# Patient Record
Sex: Female | Born: 1980 | Race: White | Hispanic: No | Marital: Married | Smoking: Never smoker
Health system: Southern US, Community
[De-identification: ages and names within clinical notes are randomized; demographics above are authoritative.]

## PROBLEM LIST (undated history)

## (undated) DIAGNOSIS — T79A29A Traumatic compartment syndrome of unspecified lower extremity, initial encounter: Secondary | ICD-10-CM

## (undated) DIAGNOSIS — K219 Gastro-esophageal reflux disease without esophagitis: Secondary | ICD-10-CM

## (undated) DIAGNOSIS — M542 Cervicalgia: Secondary | ICD-10-CM

## (undated) DIAGNOSIS — R200 Anesthesia of skin: Secondary | ICD-10-CM

## (undated) DIAGNOSIS — R51 Headache: Secondary | ICD-10-CM

## (undated) HISTORY — DX: Anesthesia of skin: R20.0

## (undated) HISTORY — PX: ARTHROSCOPIC REPAIR ACL: SUR80

## (undated) HISTORY — DX: Cervicalgia: M54.2

---
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
## 2001-06-29 HISTORY — PX: ANTERIOR COMPARTMENT DECOMPRESSION: SHX547

## 2002-06-29 DIAGNOSIS — T79A29A Traumatic compartment syndrome of unspecified lower extremity, initial encounter: Secondary | ICD-10-CM

## 2002-06-29 HISTORY — DX: Traumatic compartment syndrome of unspecified lower extremity, initial encounter: T79.A29A

## 2003-10-22 ENCOUNTER — Encounter (INDEPENDENT_AMBULATORY_CARE_PROVIDER_SITE_OTHER): Payer: Self-pay | Admitting: *Deleted

## 2003-10-22 ENCOUNTER — Ambulatory Visit (HOSPITAL_COMMUNITY): Admission: RE | Admit: 2003-10-22 | Discharge: 2003-10-22 | Payer: Self-pay | Admitting: Otolaryngology

## 2003-10-22 ENCOUNTER — Ambulatory Visit (HOSPITAL_BASED_OUTPATIENT_CLINIC_OR_DEPARTMENT_OTHER): Admission: RE | Admit: 2003-10-22 | Discharge: 2003-10-22 | Payer: Self-pay | Admitting: Otolaryngology

## 2005-09-03 ENCOUNTER — Other Ambulatory Visit: Admission: RE | Admit: 2005-09-03 | Discharge: 2005-09-03 | Payer: Self-pay | Admitting: Obstetrics & Gynecology

## 2006-02-15 ENCOUNTER — Encounter: Admission: RE | Admit: 2006-02-15 | Discharge: 2006-02-15 | Payer: Self-pay | Admitting: Obstetrics and Gynecology

## 2006-04-22 ENCOUNTER — Inpatient Hospital Stay (HOSPITAL_COMMUNITY): Admission: AD | Admit: 2006-04-22 | Discharge: 2006-04-25 | Payer: Self-pay | Admitting: Obstetrics and Gynecology

## 2006-04-26 ENCOUNTER — Encounter: Admission: RE | Admit: 2006-04-26 | Discharge: 2006-05-13 | Payer: Self-pay | Admitting: Obstetrics and Gynecology

## 2007-04-25 ENCOUNTER — Encounter: Admission: RE | Admit: 2007-04-25 | Discharge: 2007-04-25 | Payer: Self-pay | Admitting: Orthopedic Surgery

## 2010-03-08 ENCOUNTER — Inpatient Hospital Stay (HOSPITAL_COMMUNITY): Admission: RE | Admit: 2010-03-08 | Discharge: 2010-03-10 | Payer: Self-pay | Admitting: Obstetrics and Gynecology

## 2010-09-11 LAB — CBC
HCT: 34.2 % — ABNORMAL LOW (ref 36.0–46.0)
Hemoglobin: 10.1 g/dL — ABNORMAL LOW (ref 12.0–15.0)
MCHC: 34 g/dL (ref 30.0–36.0)
MCHC: 34.8 g/dL (ref 30.0–36.0)
MCV: 100.9 fL — ABNORMAL HIGH (ref 78.0–100.0)
MCV: 99.6 fL (ref 78.0–100.0)
Platelets: 180 10*3/uL (ref 150–400)
RBC: 3.39 MIL/uL — ABNORMAL LOW (ref 3.87–5.11)
RDW: 12.6 % (ref 11.5–15.5)
WBC: 10.9 10*3/uL — ABNORMAL HIGH (ref 4.0–10.5)
WBC: 10.9 10*3/uL — ABNORMAL HIGH (ref 4.0–10.5)

## 2010-09-11 LAB — RPR
RPR Ser Ql: NONREACTIVE
RPR Ser Ql: NONREACTIVE

## 2010-09-11 LAB — SURGICAL PCR SCREEN
MRSA, PCR: NEGATIVE
Staphylococcus aureus: NEGATIVE

## 2010-11-14 NOTE — Op Note (Signed)
NAME:  Mary King, Mary King                       ACCOUNT NO.:  [id number]   MEDICAL RECORD NO.:  [id number]                   PATIENT TYPE:  AMB   LOCATION:  DSC                                  FACILITY:  [facility]   PHYSICIAN:  Jefry H. Pollyann Kennedy, M.D.                DATE OF BIRTH:  [birth year]   DATE OF PROCEDURE:  10/22/2003  DATE OF DISCHARGE:                                 OPERATIVE REPORT   PREOPERATIVE DIAGNOSES:  Chronic tonsillitis and history of peritonsillar  abscess.   POSTOPERATIVE DIAGNOSES:  Chronic tonsillitis and history of peritonsillar  abscess.   OPERATION PERFORMED:  Tonsillectomy.   SURGEON:  Jefry H. Pollyann Kennedy, M.D.   ANESTHESIA:  General endotracheal.   COMPLICATIONS:  None.   ESTIMATED BLOOD LOSS:  30 mL.   FINDINGS:  Large fibrotic tonsils with severe adhesion on the inferior pole  on the left and small abscess right superior pole.   REFERRING PHYSICIAN:  Stan Head. Cleta Alberts, M.D.   INDICATIONS FOR PROCEDURE:  The patient is a 30 year old female with a  history of chronic tonsillitis over the past several years and a history of  a left peritonsillar abscess last year, and what sounds by history to be a  very small early peritonsillar abscess on the right side a few weeks ago  that was successfully treated with systemic antibiotic therapy.  The risks,  benefits, alternatives and complications of the procedure were explained to  the patient, who seemed to understand and agreed to surgery.   DESCRIPTION OF PROCEDURE:  The patient was taken to the operating room and  placed on the operating table in the supine position.  Following induction  of general endotracheal anesthesia, the table was turned 90 degrees and the  patient was draped in standard fashion.  A Crowe-Davis mouth gag was  inserted into the oral cavity and used to retract the tongue and mandible  and attached to the Mayo stand.  A red rubber catheter was inserted into the  right side of the nose and  withdrawn through the mouth and used to retract  the soft palate and uvula.  The tonsillectomy was performed using  electrocautery dissection to dissect the capsule from the constrictor  muscles.  A fissure knife was used on the left side to dissect through some  of the dense fibrotic area inferiorly and the Herd dissector was used to do  a similar function on the right side superiorly.  The tonsils were removed  and sent for pathologic  evaluation.  Suction cautery was used to provide hemostasis.  The pharynx  was suctioned of blood and secretions and irrigated with saline solution.  An orogastric tube was used to aspirate the contents of the stomach.  The  patient was then awakened, extubated and transferred to recovery in stable  condition.  Jefry H. Pollyann Kennedy, M.D.    J[facility]/MEDQ  D:  10/22/2003  T:  10/22/2003  Job:  [id number]   cc:   Brett Canales A. Cleta Alberts, M.D.  [address]  [place]  [place] [address]  Fax: [phone]

## 2010-11-14 NOTE — Op Note (Signed)
NAME:  Mary King, Mary King            ACCOUNT NO.:  [id number]   MEDICAL RECORD NO.:  [id number]          PATIENT TYPE:  INP   LOCATION:  9119                          [facility]ILITY[facility]  [facility]   PHYSICIAN:  Maxie Better, M.D.DATE OF BIRTH:  [birth year]   DATE OF PROCEDURE:  04/22/2006  DATE OF DISCHARGE:                                 OPERATIVE REPORT   PREOPERATIVE DIAGNOSIS:  Fetal macrosomia, class A1 gestational diabetes,  term gestation.   POSTOPERATIVE DIAGNOSIS:  Class A1 gestational diabetes, term gestation.   PROCEDURE:  Primary cesarean section Kerr hysterotomy.   ANESTHESIA:  Spinal.   SURGEON:  Maxie Better, M.D.   ASSISTANT:  None.   INDICATIONS:  30 year old gravida 1, para 0, married white female at term  with class A1 gestational diabetes well controlled who was found to have an  estimated fetal weight of 4190 grams on ultrasound and now presents for  primary cesarean section as a result of fetal macrosomia.  The risks and  benefits of the procedure have been explained to the patient, consent was  signed. The patient was transferred to the operating room.   PROCEDURE:  Under adequate spinal anesthesia, the patient was placed in a  supine position with a left lateral tilt.  She was sterilely prepped and  draped in the usual fashion.  An indwelling Foley catheter was sterilely  placed.  0.25% Marcaine was injected along the planned Pfannenstiel  incision.  A skin incision was then made and carried down to the rectus  fascia.  The rectus fascia was opened transversely.  The rectus fascia was  then bluntly and sharply dissected off the rectus muscle in a superior and  inferior fashion.  The rectus muscles were split in the midline.  The  parietal peritoneum was entered sharply and extended superiorly and  inferiorly. On entering the abdominal cavity, it was noted that the vertex  was overriding the suprapubic bone.  The lower uterine segment was,  otherwise, well  developed. The vesicouterine peritoneum was opened  transversely.  The bladder was then bluntly dissected off the lower uterine  segment and displaced inferiorly with a bladder retractor.  A curvilinear  low transverse uterine incision was then made, extended bilaterally using  bandage scissors.  Artificial rupture of membranes was performed.  Clear  amniotic fluid was noted.  A loop of cord was noted on entering the uterine  cavity.  Subsequent delivery of a live female using vacuum for assistance  was accomplished.  The baby was bulb suctioned on the abdomen. Cord around  the neck x1 was reducible.  The cord was then clamped, cut, and the baby was  transferred to the awaiting pediatrician who assigned Apgars of 9 and 9 at 1  and 5 minutes respectively.  The placenta was anterior, manually removed.  The uterine cavity was cleaned of debris.  The uterine incision had no  extension and was closed in two layers, the first layer a 0 Monocryl running  locked stitch, the second layer was imbricated using 0 Monocryl suture.  Good hemostasis was subsequently noted.  The parietal and the vesicouterine  peritoneum were not closed.  Normal tubes and ovaries were noted  bilaterally.  The abdomen was copiously irrigated and suctioned of debris.  The parietal peritoneum was closed with 2-0 Vicryl running stitch.  The  rectus fascia was closed with 0 Vicryl x2.  The subcutaneous areas were  approximated using 2-0 plain sutures and the skin approximated using Ethicon  staples.   SPECIMENS:  Placenta not sent to pathology.   ESTIMATED BLOOD LOSS:  700 mL.   INTEROPERATIVE FLUID:  2500 mL.   URINE OUTPUT:  100 mL.   COUNTS:  Sponge and instrument counts x2 was correct.   COMPLICATION:  None.   DISPOSITION:  The patient tolerated the procedure well.  The weight of the  baby was 7 pounds 13 ounces.  The patient was transferred to the recovery  room in stable condition.      Maxie Better,  M.D.  Electronically Signed     [place]/MEDQ  D:  04/22/2006  T:  04/24/2006  Job:  [id number]

## 2010-11-14 NOTE — Discharge Summary (Signed)
Mary King, Mary King            ACCOUNT NO.:  [id number]   MEDICAL RECORD NO.:  [id number]          PATIENT TYPE:  INP   LOCATION:  9119                          [facility]ILITY[facility]   PHYSICIAN:  Maxie Better, M.D.DATE OF BIRTH:  [birth year]   DATE OF ADMISSION:  04/22/2006  DATE OF DISCHARGE:  04/25/2006                               DISCHARGE SUMMARY   ADMISSION DIAGNOSES:  1. Term gestation.  2. Class A1 gestational diabetes.  3. Fetal macrosomia.   DISCHARGE DIAGNOSES:  1. Term gestation, delivered.  2. Class A1 gestational diabetes.  3. Status post primary cesarean section.   PROCEDURE:  Primary cesarean section, Kerr hysterotomy.   HISTORY OF PRESENT ILLNESS:  This is a 31 year old G1, P0 female at term  with class A1 gestational diabetes diagnosed with fetal macrosomia who  presents for primary cesarean section.   HOSPITAL COURSE:  The patient was admitted for primary cesarean section.  She underwent an uncomplicated primary cesarean section under spinal  anesthesia. The procedure resulted in the delivery of a 7 pounds 13  ounces live female, Apgars of 9 and 9. Normal tubes and ovaries were  identified at the time of surgery. There was cord around the neck x1.  The patient had uncomplicated postoperative course. Her CBC on postop  day #1 showed a hemoglobin of 10.6, hematocrit of 30.2, white count of  13.1, platelet count 236,000. By postop day #3 the patient was  tolerating a regular diet, no evidence of infection. Incision was  without erythema, induration or exudate. She was deemed well to be  discharged home.   DISPOSITION:  Home.   CONDITION ON DISCHARGE:  Stable.   DISCHARGE MEDICATIONS:  1. Tylox 1-2 tablets every 4-6 hours p.r.n. pain.  2. Colace 1-2 tablets as needed.  3. Motrin 600 mg 1 p.o., q.6 hours, p.r.n. pain.   DISCHARGE INSTRUCTIONS:  Per the postpartum booklet given.   FOLLOWUP:  Appointment at [facility] in 6 weeks.      Maxie Better, M.D.  Electronically Signed     [place]/MEDQ  D:  05/23/2006  T:  05/23/2006  Job:  [id number]

## 2012-02-18 LAB — OB RESULTS CONSOLE RUBELLA ANTIBODY, IGM: Rubella: IMMUNE

## 2012-02-18 LAB — OB RESULTS CONSOLE HIV ANTIBODY (ROUTINE TESTING): HIV: NONREACTIVE

## 2012-02-18 LAB — OB RESULTS CONSOLE ABO/RH: RH Type: POSITIVE

## 2012-03-01 ENCOUNTER — Other Ambulatory Visit: Payer: Self-pay

## 2012-04-25 ENCOUNTER — Other Ambulatory Visit (HOSPITAL_COMMUNITY): Payer: Self-pay | Admitting: Obstetrics and Gynecology

## 2012-04-25 DIAGNOSIS — O283 Abnormal ultrasonic finding on antenatal screening of mother: Secondary | ICD-10-CM

## 2012-04-27 ENCOUNTER — Other Ambulatory Visit (HOSPITAL_COMMUNITY): Payer: Self-pay | Admitting: Obstetrics and Gynecology

## 2012-04-27 ENCOUNTER — Ambulatory Visit (HOSPITAL_COMMUNITY)
Admission: RE | Admit: 2012-04-27 | Discharge: 2012-04-27 | Disposition: A | Discharge status: Home / Self Care | Payer: BC Managed Care – PPO | Source: Ambulatory Visit | Attending: Obstetrics and Gynecology | Admitting: Obstetrics and Gynecology

## 2012-04-27 ENCOUNTER — Encounter (HOSPITAL_COMMUNITY): Payer: Self-pay

## 2012-04-27 DIAGNOSIS — Z1389 Encounter for screening for other disorder: Secondary | ICD-10-CM | POA: Insufficient documentation

## 2012-04-27 DIAGNOSIS — O283 Abnormal ultrasonic finding on antenatal screening of mother: Secondary | ICD-10-CM

## 2012-04-27 DIAGNOSIS — O34219 Maternal care for unspecified type scar from previous cesarean delivery: Secondary | ICD-10-CM | POA: Insufficient documentation

## 2012-04-27 DIAGNOSIS — Z363 Encounter for antenatal screening for malformations: Secondary | ICD-10-CM | POA: Insufficient documentation

## 2012-04-27 DIAGNOSIS — O358XX0 Maternal care for other (suspected) fetal abnormality and damage, not applicable or unspecified: Secondary | ICD-10-CM | POA: Insufficient documentation

## 2012-04-27 LAB — AP-AFP (ALPHA FETOPROTEIN)

## 2012-04-27 NOTE — Progress Notes (Signed)
[facility] ultrasound  Indication: 31 yr old [id number] at [redacted]w[redacted]d for fetal anatomic survey. Findings of enlarged nuchal fold and echogenic bowel on outside ultrasound.  Findings: 1. Single intrauterine pregnancy. 2. Fetal biometry is consistent with dating. 3. Posterior placenta without evidence of previa. 4. Normal amniotic fluid volume. 5. Normal transabdominal cervical length. 6. There is an enlarged nuchal fold measuring 6.59mm. 7. The bowel appears echogenic. 8. The remainder of the anatomic survey is normal.  Recommendations:  1. Appropriate fetal growth. 2. Thickened nuchal fold: - see genetic counselor consultation - discussed association with fetal aneuploidy - reviewed risks/benefits of amniocentesis; after counseling patient opted for amniocentesis which was performed without complication 3. Echogenic bowel: - see genetic counselor consultation I reiterated the associations of echogenic bowel with fetal aneuploidy, cystic fibrosis, congenital infection, and bleeding.  There is a higher risk of fetal growth restriction in fetuses with echogenic bowel (up to 20%).  Congenital infections such as toxoplasmosis and CMV have also been associated with echogenic bowel.  Given the above the patient had blood work drawn to screen for cystic fibrosis carrier status and the amniotic fluid will be sent for toxoplasmosis and CMV PCR. 4. If all the above results are normal recommend follow up ultrasound for fetal growth at [redacted] weeks gestation; with serial fetal growth in the third trimester given the increased risk of fetal growth restriction. If any of the above results are abnormal will counsel accordingly.  Eulis Foster, MD

## 2012-04-27 NOTE — Progress Notes (Signed)
Genetic Counseling  High-Risk Gestation Note  Appointment Date:  04/27/2012 Referred By: Serita Kyle, * Date of Birth:  [birth year]  Pregnancy History: [id number] Estimated Date of Delivery: 09/20/12 Estimated Gestational Age: [redacted]w[redacted]d   I met with [name] and her husband, Mary King, for genetic counseling because of abnormal ultrasound findings. Mary King, [facility] Genetic Counseling Intern, observed the session and obtained the family history.    Mary King had ultrasound at [facility] on Monday, April 25, 2012, which revealed fetal echogenic bowel and a thickened nuchal fold.  The patient was referred to [facility] for ultrasound an consultation.  Ultrasound today confirmed the findings.  The ultrasound report will be documented separately.  This couple was counseled that the second trimester genetic ultrasound is targeted at identifying features associated with aneuploidy as well as other conditions.  It has evolved as a screening tool used to provide an individualized risk assessment for Down syndrome and other trisomies.  The ability of sonography to aid in the detection of aneuploidies relies on identification of both major structural anomalies and soft markers.  They were counseled that markers have a known association with aneuploidy and that some are associated with higher risk than others.  The detection of multiple markers greatly increases the suspicion of aneuploidy.    We discussed that the bowel is defined as being echogenic if it is as bright as adjacent bone.  Echogenic bowel is present in ~0.6-2.4% of normal fetuses, but is associated with a six fold increase in risk for aneuploidy (when observed in isolation).  In addition, it is associated with a 2% risk for fetal cystic fibrosis, 3% risk for a congenital infection, and a 3% risk for a bleed.  Additionally, they were counseled that the nuchal fold (NF) refers to the  skin thickness in the posterior aspect of the fetal neck. This area is traditionally measurable between 15 and 19.[redacted] weeks gestation and is considered enlarged at ?6 mm.  This couple was counseled regarding the various common etiologies for an enlarged NF including: aneuploidy, other chromosome aberrations, single gene conditions, cardiac or great vessel abnormalities, and normal variation.    We reviewed chromosomes, nondisjunction, and the common features and prognoses of Down syndrome and other aneuploidies. Considering Mary King's ultrascreen result (1 in 10,000 risks for Down syndrome and trisomies 13/18) and the combination of ultrasound findings, the risk for fetal aneuploidy is estimated to be ~5%. We also discussed single gene conditions and that these conditions are not routinely tested for prenatally unless ultrasound findings or family history significantly increase the suspicion of a specific single gene disorder. We briefly reviewed common inheritance patterns (dominant, recessive, and X-linked) as well as the associated risks of recurrence.   In addition, we discussed that there is a known correlation between fetal heart defects and increased NF measurements.  Although this data is less clear than the association found between abnormal nuchal translucency measurements and fetal heart defects, the finding of an increased NF warrants further cardiac investigation/fetal echocardiogram .   They were then counseled regarding their options of noninvasive prenatal testing (also referred to as cell free fetal DNA testing), amniocentesis, serial sonography, cystic fibrosis carrier screening, maternal serum/amniotic fluid infectious titers, and fetal echocardiogram.  We reviewed the benefits, limitations, and risks of these options. After thoughtful consideration, they elected to proceed with amniocentesis for both chromosome and infectious analyses.  They both elected to have cystic fibrosis carrier  screening today.  Fetal echocardiogram and a follow up ultrasound were scheduled today.    They were counseled that the fetal prognosis depends on the underlying etiology of the ultrasound findings and further anticipatory guidance can be provided if a diagnosis is discovered.  We discussed the availability of a postnatal consult with a pediatric geneticist to help determine the etiology, if warranted.  We discussed the availability of prenatal microarray analysis including the limitations and benefits of this technology, if the karyotype is normal.  They were undecided about this option, given the risk for discovering a variant of unknown significance with this technology.   Both family histories were reviewed and found to be noncontributory for birth defects, mental retardation, and known genetic conditions. Without further information regarding the provided family history, an accurate genetic risk cannot be calculated. Further genetic counseling is warranted if more information is obtained.  Mary King denied exposure to environmental toxins or chemical agents. She denied the use of alcohol, tobacco or street drugs. She denied significant viral illnesses during the course of her pregnancy.   In addition, I spoke to the patient about the [facility] amniotic fluid collection research study (IRB# [id number]). The study was explained to the patient and any questions were answered. The patient gave her consent to take part, signed the consent form and copy of the signed form was given to her. The patient's blood was drawn and amniotic fluid collected per the research protocol.  I counseled Mary King  regarding the above risks and available options.  The approximate face-to-face time with the genetic counselor was 65 minutes.  Donald Prose, MS Certified Genetic Counselor

## 2012-04-28 ENCOUNTER — Other Ambulatory Visit: Payer: Self-pay

## 2012-05-02 ENCOUNTER — Telehealth (HOSPITAL_COMMUNITY): Payer: Self-pay

## 2012-05-02 NOTE — Telephone Encounter (Signed)
Called Mary King to discuss the preliminary FISH results from her amniocentesis. We reviewed that these are within normal limits.  We again discussed the limitations of FISH and that final results are still pending and will be available in 1-2 weeks.  Patient reported that she has not noticed any concerning symptoms or complications following the procedure.    In addition, we reviewed the results from her and her husband's CF carrier testing. 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  We reviewed that this is the most common of the CFTR gene alterations and is a severe alteration.  She understands that carriers have no symptoms from the gene change.  We reviewed recessive inheritance.  Mary King results were negative for the common 32 gene changes, reducing the likelihood that he is a carrier from 1 in 39 to 1 in 250.  The risk for CF in the fetus is estimated to be ~1 in 1000, given these two results.  However, when considering the finding of echogenic bowel, we discussed that the risk of fetal CF is actually higher, and could be as high as 2%.  We reviewed additional screening/testing options for Mary King including: an extended CFTR mutation panel (which raises the detection rate by a few percent-depending on the lab) or CFTR gene sequencing.  We discussed that gene sequencing turn around time is ~4 weeks.  They understand that prenatal testing for CF is most informative when both parents have identified CFTR gene alterations.  We again reviewed the availability of newborn screening for CF and the common features and prognosis of CF.  This couple does not wish to pursue any additional testing for CF at this time.  All questions were answered to her satisfaction, she was encouraged to call with additional questions or concerns.  Despina Arias, MS Certified Genetic Counselor

## 2012-05-24 ENCOUNTER — Telehealth (HOSPITAL_COMMUNITY): Payer: Self-pay

## 2012-06-08 NOTE — Telephone Encounter (Signed)
Called Ms. Stallsmith to review the final results of her chromosome analysis.  These results are within normal limits.  We discussed the availability of prenatal microarray analysis.  She was counseled that microarray analysis detects a genomic copy number variant (CNV) in ~6% of fetuses with an abnormal ultrasound finding(s) and a normal karyotype; however, the significance of that CNV may not be known. After thoughtful consideration of this option, she declined.    We again briefly reviewed the availability of f/u CFTR testing for Mr. Eliot; Mrs. Foot again declined.  She understands that the significance of the u/s findings may not be known during pregnancy.  We again reviewed other possible explanations including normal variation, single gene conditions, and chromosome aberrations not detectable by routine chromosome analysis.  We reviewed the availability of a postnatal consultation with a medical geneticist, if warranted.  All questions were answered.  The patient was encouraged to contact us with additional questions and concerns. Despina Arias, MS Certified Genetic Counselor

## 2012-07-01 ENCOUNTER — Ambulatory Visit (HOSPITAL_COMMUNITY)
Admission: RE | Admit: 2012-07-01 | Discharge: 2012-07-01 | Disposition: A | Discharge status: Home / Self Care | Payer: BC Managed Care – PPO | Source: Ambulatory Visit | Attending: Obstetrics and Gynecology | Admitting: Obstetrics and Gynecology

## 2012-07-01 VITALS — BP 127/81 | HR 110 | Wt 180.2 lb

## 2012-07-01 DIAGNOSIS — O34219 Maternal care for unspecified type scar from previous cesarean delivery: Secondary | ICD-10-CM | POA: Insufficient documentation

## 2012-07-01 DIAGNOSIS — O283 Abnormal ultrasonic finding on antenatal screening of mother: Secondary | ICD-10-CM

## 2012-07-01 DIAGNOSIS — O09299 Supervision of pregnancy with other poor reproductive or obstetric history, unspecified trimester: Secondary | ICD-10-CM | POA: Insufficient documentation

## 2012-07-01 DIAGNOSIS — O358XX0 Maternal care for other (suspected) fetal abnormality and damage, not applicable or unspecified: Secondary | ICD-10-CM | POA: Insufficient documentation

## 2012-07-01 NOTE — Progress Notes (Signed)
Mary King  was seen today for an ultrasound appointment.  See full report in [facility].  Comments: [name] returns for follow up.  On previous ultrasound, noted to have echogenic bowel and thickened nuchal fold.  Amniocentesis was performed which revealed a normal karyotype.  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  Risk of viral infection (CMV/Toxo) was discussed at previous evaluation and there was some discussion of sending amniotic fluid for PCR, but the results are either not available or were not performed at that time.  This was discussed with the patient - offered serum testing for TORCH titers.  The patient declined further work up for echogenic bowel at this time.  Impression: Single IUP at 28 3/7 weeks Interval growth is appropriate (60th %tile) The fetal bowel is prominent, but not as echogenic as adjacent bone The fetal anatomy is otherwise within normal limits Normal amniotic fluid volume  Recommendations: Recommend follow-up ultrasound examination in 4 weeks for interval growth.  Alpha Gula, MD

## 2012-07-29 ENCOUNTER — Ambulatory Visit (HOSPITAL_COMMUNITY): Admission: RE | Admit: 2012-07-29 | Payer: BC Managed Care – PPO | Source: Ambulatory Visit

## 2012-08-19 ENCOUNTER — Other Ambulatory Visit: Payer: Self-pay | Admitting: Obstetrics and Gynecology

## 2012-08-30 ENCOUNTER — Encounter (HOSPITAL_COMMUNITY): Payer: Self-pay | Admitting: Pharmacist

## 2012-09-12 ENCOUNTER — Encounter (HOSPITAL_COMMUNITY)
Admission: RE | Admit: 2012-09-12 | Discharge: 2012-09-12 | Disposition: A | Discharge status: Home / Self Care | Payer: BC Managed Care – PPO | Source: Ambulatory Visit | Attending: Obstetrics and Gynecology | Admitting: Obstetrics and Gynecology

## 2012-09-12 ENCOUNTER — Encounter (HOSPITAL_COMMUNITY): Payer: Self-pay

## 2012-09-12 HISTORY — DX: Headache: R51

## 2012-09-12 HISTORY — DX: Gastro-esophageal reflux disease without esophagitis: K21.9

## 2012-09-12 HISTORY — DX: Traumatic compartment syndrome of unspecified lower extremity, initial encounter: T79.A29A

## 2012-09-12 LAB — CBC
MCH: 33.3 pg (ref 26.0–34.0)
MCHC: 33 g/dL (ref 30.0–36.0)
Platelets: 248 10*3/uL (ref 150–400)
RDW: 13.6 % (ref 11.5–15.5)

## 2012-09-12 NOTE — Patient Instructions (Addendum)
   Your procedure is scheduled XB:[id number] March 18th  Enter through the Main Entrance of [facility] at:6am Pick up the phone at the desk and dial [phone] and inform us of your arrival.  Please call this number if you have any problems the morning of surgery: [phone]  Remember: Do not eat or drink anything after midnight on Monday   Do not wear jewelry, make-up, or FINGER nail polish No metal in your hair or on your body. Do not wear lotions, powders, perfumes. You may wear deodorant.  Please use your CHG wash as directed prior to surgery.  Do not shave anywhere for at least 12 hours prior to first CHG shower.  Do not bring valuables to the hospital.   Leave suitcase in the car. After Surgery it may be brought to your room. For patients being admitted to the hospital, checkout time is 11:00am the day of discharge.

## 2012-09-13 ENCOUNTER — Inpatient Hospital Stay (HOSPITAL_COMMUNITY): Payer: BC Managed Care – PPO | Admitting: Registered Nurse

## 2012-09-13 ENCOUNTER — Encounter (HOSPITAL_COMMUNITY)
Admission: RE | Disposition: A | Discharge status: Home / Self Care | Payer: Self-pay | Source: Ambulatory Visit | Attending: Obstetrics and Gynecology

## 2012-09-13 ENCOUNTER — Encounter (HOSPITAL_COMMUNITY): Payer: Self-pay | Admitting: *Deleted

## 2012-09-13 ENCOUNTER — Encounter (HOSPITAL_COMMUNITY): Payer: Self-pay | Admitting: Registered Nurse

## 2012-09-13 ENCOUNTER — Inpatient Hospital Stay (HOSPITAL_COMMUNITY)
Admission: RE | Admit: 2012-09-13 | Discharge: 2012-09-15 | DRG: 370 | Disposition: A | Discharge status: Home / Self Care | Payer: BC Managed Care – PPO | Source: Ambulatory Visit | Attending: Obstetrics and Gynecology | Admitting: Obstetrics and Gynecology

## 2012-09-13 DIAGNOSIS — O9903 Anemia complicating the puerperium: Secondary | ICD-10-CM | POA: Diagnosis not present

## 2012-09-13 DIAGNOSIS — O9902 Anemia complicating childbirth: Secondary | ICD-10-CM | POA: Diagnosis present

## 2012-09-13 DIAGNOSIS — D62 Acute posthemorrhagic anemia: Secondary | ICD-10-CM | POA: Diagnosis not present

## 2012-09-13 DIAGNOSIS — O34219 Maternal care for unspecified type scar from previous cesarean delivery: Principal | ICD-10-CM | POA: Diagnosis present

## 2012-09-13 DIAGNOSIS — Z98891 History of uterine scar from previous surgery: Secondary | ICD-10-CM | POA: Diagnosis present

## 2012-09-13 LAB — RPR: RPR Ser Ql: NONREACTIVE

## 2012-09-13 SURGERY — Surgical Case
Anesthesia: Spinal | Site: Abdomen | Wound class: Clean Contaminated

## 2012-09-13 MED ORDER — SIMETHICONE 80 MG PO CHEW
80.0000 mg | CHEWABLE_TABLET | Freq: Three times a day (TID) | ORAL | Status: DC
  Administered 2012-09-13 – 2012-09-15 (×5): 80 mg via ORAL

## 2012-09-13 MED ORDER — SODIUM CHLORIDE 0.9 % IV SOLN: 250.0000 mL | INTRAVENOUS | Status: DC

## 2012-09-13 MED ORDER — NALOXONE HCL 0.4 MG/ML IJ SOLN: 0.4000 mg | INTRAMUSCULAR | Status: DC | PRN

## 2012-09-13 MED ORDER — ONDANSETRON HCL 4 MG/2ML IJ SOLN: 4.0000 mg | INTRAMUSCULAR | Status: DC | PRN

## 2012-09-13 MED ORDER — CEFAZOLIN SODIUM-DEXTROSE 2-3 GM-% IV SOLR
2.0000 g | INTRAVENOUS | Status: AC
  Administered 2012-09-13: 2 g via INTRAVENOUS

## 2012-09-13 MED ORDER — SENNOSIDES-DOCUSATE SODIUM 8.6-50 MG PO TABS
2.0000 | ORAL_TABLET | Freq: Every day | ORAL | Status: DC
  Administered 2012-09-13 – 2012-09-14 (×2): 2 via ORAL

## 2012-09-13 MED ORDER — ONDANSETRON HCL 4 MG/2ML IJ SOLN: 4.0000 mg | Freq: Three times a day (TID) | INTRAMUSCULAR | Status: DC | PRN

## 2012-09-13 MED ORDER — BUPIVACAINE HCL (PF) 0.25 % IJ SOLN
INTRAMUSCULAR | Status: AC
  Filled 2012-09-13: qty 30

## 2012-09-13 MED ORDER — FLEET ENEMA 7-19 GM/118ML RE ENEM: 1.0000 | ENEMA | Freq: Every day | RECTAL | Status: DC | PRN

## 2012-09-13 MED ORDER — DIPHENHYDRAMINE HCL 50 MG/ML IJ SOLN: 25.0000 mg | INTRAMUSCULAR | Status: DC | PRN

## 2012-09-13 MED ORDER — FENTANYL CITRATE 0.05 MG/ML IJ SOLN
INTRAMUSCULAR | Status: DC | PRN
  Administered 2012-09-13: 35 ug via INTRAVENOUS
  Administered 2012-09-13: 15 ug via INTRATHECAL
  Administered 2012-09-13: 50 ug via INTRAVENOUS

## 2012-09-13 MED ORDER — KETOROLAC TROMETHAMINE 30 MG/ML IJ SOLN: 30.0000 mg | Freq: Four times a day (QID) | INTRAMUSCULAR | Status: AC | PRN

## 2012-09-13 MED ORDER — FENTANYL CITRATE 0.05 MG/ML IJ SOLN: 25.0000 ug | INTRAMUSCULAR | Status: DC | PRN

## 2012-09-13 MED ORDER — BUPIVACAINE IN DEXTROSE 0.75-8.25 % IT SOLN
INTRATHECAL | Status: DC | PRN
  Administered 2012-09-13: 1.4 mg via INTRATHECAL

## 2012-09-13 MED ORDER — METHYLERGONOVINE MALEATE 0.2 MG PO TABS: 0.2000 mg | ORAL_TABLET | ORAL | Status: DC | PRN

## 2012-09-13 MED ORDER — ONDANSETRON HCL 4 MG/2ML IJ SOLN
INTRAMUSCULAR | Status: AC
  Filled 2012-09-13: qty 2

## 2012-09-13 MED ORDER — FERROUS SULFATE 325 (65 FE) MG PO TABS
325.0000 mg | ORAL_TABLET | Freq: Two times a day (BID) | ORAL | Status: DC
  Administered 2012-09-14 – 2012-09-15 (×3): 325 mg via ORAL
  Filled 2012-09-13 (×3): qty 1

## 2012-09-13 MED ORDER — LANOLIN HYDROUS EX OINT: 1.0000 "application " | TOPICAL_OINTMENT | CUTANEOUS | Status: DC | PRN

## 2012-09-13 MED ORDER — PHENYLEPHRINE 40 MCG/ML (10ML) SYRINGE FOR IV PUSH (FOR BLOOD PRESSURE SUPPORT)
PREFILLED_SYRINGE | INTRAVENOUS | Status: AC
  Filled 2012-09-13: qty 5

## 2012-09-13 MED ORDER — IBUPROFEN 600 MG PO TABS
600.0000 mg | ORAL_TABLET | Freq: Four times a day (QID) | ORAL | Status: DC
  Administered 2012-09-14 – 2012-09-15 (×7): 600 mg via ORAL
  Filled 2012-09-13 (×3): qty 1

## 2012-09-13 MED ORDER — NALBUPHINE HCL 10 MG/ML IJ SOLN
5.0000 mg | INTRAMUSCULAR | Status: DC | PRN
  Filled 2012-09-13: qty 1

## 2012-09-13 MED ORDER — BISACODYL 10 MG RE SUPP: 10.0000 mg | Freq: Every day | RECTAL | Status: DC | PRN

## 2012-09-13 MED ORDER — BUPIVACAINE HCL (PF) 0.25 % IJ SOLN
INTRAMUSCULAR | Status: DC | PRN
  Administered 2012-09-13: 7 mL

## 2012-09-13 MED ORDER — NALOXONE HCL 1 MG/ML IJ SOLN
1.0000 ug/kg/h | INTRAVENOUS | Status: DC | PRN
  Filled 2012-09-13: qty 2

## 2012-09-13 MED ORDER — DIBUCAINE 1 % RE OINT: 1.0000 "application " | TOPICAL_OINTMENT | RECTAL | Status: DC | PRN

## 2012-09-13 MED ORDER — SCOPOLAMINE 1 MG/3DAYS TD PT72: 1.0000 | MEDICATED_PATCH | Freq: Once | TRANSDERMAL | Status: DC

## 2012-09-13 MED ORDER — MEPERIDINE HCL 25 MG/ML IJ SOLN
6.2500 mg | INTRAMUSCULAR | Status: AC | PRN
  Administered 2012-09-13: 6.25 mg via INTRAVENOUS

## 2012-09-13 MED ORDER — DIPHENHYDRAMINE HCL 25 MG PO CAPS: 25.0000 mg | ORAL_CAPSULE | ORAL | Status: DC | PRN

## 2012-09-13 MED ORDER — MORPHINE SULFATE (PF) 0.5 MG/ML IJ SOLN
INTRAMUSCULAR | Status: DC | PRN
  Administered 2012-09-13: .1 mg via INTRATHECAL

## 2012-09-13 MED ORDER — SCOPOLAMINE 1 MG/3DAYS TD PT72
MEDICATED_PATCH | TRANSDERMAL | Status: AC
  Administered 2012-09-13: 1.5 mg via TRANSDERMAL
  Filled 2012-09-13: qty 1

## 2012-09-13 MED ORDER — SODIUM CHLORIDE 0.9 % IJ SOLN: 3.0000 mL | Freq: Two times a day (BID) | INTRAMUSCULAR | Status: DC

## 2012-09-13 MED ORDER — SIMETHICONE 80 MG PO CHEW: 80.0000 mg | CHEWABLE_TABLET | ORAL | Status: DC | PRN

## 2012-09-13 MED ORDER — KETOROLAC TROMETHAMINE 60 MG/2ML IM SOLN
INTRAMUSCULAR | Status: AC
  Administered 2012-09-13: 60 mg via INTRAMUSCULAR
  Filled 2012-09-13: qty 2

## 2012-09-13 MED ORDER — DIPHENHYDRAMINE HCL 25 MG PO CAPS: 25.0000 mg | ORAL_CAPSULE | Freq: Four times a day (QID) | ORAL | Status: DC | PRN

## 2012-09-13 MED ORDER — PRENATAL MULTIVITAMIN CH
1.0000 | ORAL_TABLET | Freq: Every day | ORAL | Status: DC
  Administered 2012-09-15: 1 via ORAL
  Filled 2012-09-13: qty 1

## 2012-09-13 MED ORDER — KETOROLAC TROMETHAMINE 30 MG/ML IJ SOLN
30.0000 mg | Freq: Four times a day (QID) | INTRAMUSCULAR | Status: AC | PRN
  Administered 2012-09-13: 30 mg via INTRAVENOUS
  Filled 2012-09-13: qty 1

## 2012-09-13 MED ORDER — ONDANSETRON HCL 4 MG/2ML IJ SOLN
INTRAMUSCULAR | Status: DC | PRN
  Administered 2012-09-13: 4 mg via INTRAVENOUS

## 2012-09-13 MED ORDER — SODIUM CHLORIDE 0.9 % IJ SOLN: 3.0000 mL | INTRAMUSCULAR | Status: DC | PRN

## 2012-09-13 MED ORDER — LACTATED RINGERS IV SOLN
INTRAVENOUS | Status: DC
  Administered 2012-09-13 (×3): via INTRAVENOUS

## 2012-09-13 MED ORDER — METOCLOPRAMIDE HCL 5 MG/ML IJ SOLN: 10.0000 mg | Freq: Three times a day (TID) | INTRAMUSCULAR | Status: DC | PRN

## 2012-09-13 MED ORDER — OXYTOCIN 10 UNIT/ML IJ SOLN
40.0000 [IU] | INTRAVENOUS | Status: DC | PRN
  Administered 2012-09-13: 40 [IU] via INTRAVENOUS

## 2012-09-13 MED ORDER — FENTANYL CITRATE 0.05 MG/ML IJ SOLN
INTRAMUSCULAR | Status: AC
  Filled 2012-09-13: qty 2

## 2012-09-13 MED ORDER — ZOLPIDEM TARTRATE 5 MG PO TABS: 5.0000 mg | ORAL_TABLET | Freq: Every evening | ORAL | Status: DC | PRN

## 2012-09-13 MED ORDER — KETOROLAC TROMETHAMINE 60 MG/2ML IM SOLN: 60.0000 mg | Freq: Once | INTRAMUSCULAR | Status: AC | PRN

## 2012-09-13 MED ORDER — OXYTOCIN 10 UNIT/ML IJ SOLN
INTRAMUSCULAR | Status: AC
  Filled 2012-09-13: qty 4

## 2012-09-13 MED ORDER — ONDANSETRON HCL 4 MG PO TABS: 4.0000 mg | ORAL_TABLET | ORAL | Status: DC | PRN

## 2012-09-13 MED ORDER — OXYTOCIN 40 UNITS IN LACTATED RINGERS INFUSION - SIMPLE MED: 62.5000 mL/h | INTRAVENOUS | Status: AC

## 2012-09-13 MED ORDER — IBUPROFEN 600 MG PO TABS
600.0000 mg | ORAL_TABLET | Freq: Four times a day (QID) | ORAL | Status: DC | PRN
  Filled 2012-09-13 (×4): qty 1

## 2012-09-13 MED ORDER — MENTHOL 3 MG MT LOZG: 1.0000 | LOZENGE | OROMUCOSAL | Status: DC | PRN

## 2012-09-13 MED ORDER — WITCH HAZEL-GLYCERIN EX PADS: 1.0000 "application " | MEDICATED_PAD | CUTANEOUS | Status: DC | PRN

## 2012-09-13 MED ORDER — MORPHINE SULFATE 0.5 MG/ML IJ SOLN
INTRAMUSCULAR | Status: AC
  Filled 2012-09-13: qty 10

## 2012-09-13 MED ORDER — METHYLERGONOVINE MALEATE 0.2 MG/ML IJ SOLN: 0.2000 mg | INTRAMUSCULAR | Status: DC | PRN

## 2012-09-13 MED ORDER — MEPERIDINE HCL 25 MG/ML IJ SOLN
INTRAMUSCULAR | Status: AC
  Administered 2012-09-13: 6.25 mg via INTRAVENOUS
  Filled 2012-09-13: qty 1

## 2012-09-13 MED ORDER — PHENYLEPHRINE HCL 10 MG/ML IJ SOLN
INTRAMUSCULAR | Status: DC | PRN
  Administered 2012-09-13 (×5): 80 ug via INTRAVENOUS

## 2012-09-13 MED ORDER — DIPHENHYDRAMINE HCL 50 MG/ML IJ SOLN: 12.5000 mg | INTRAMUSCULAR | Status: DC | PRN

## 2012-09-13 MED ORDER — OXYCODONE-ACETAMINOPHEN 5-325 MG PO TABS
1.0000 | ORAL_TABLET | ORAL | Status: DC | PRN
  Administered 2012-09-13 – 2012-09-14 (×5): 1 via ORAL
  Administered 2012-09-14 – 2012-09-15 (×5): 2 via ORAL
  Filled 2012-09-13 (×2): qty 2
  Filled 2012-09-13 (×2): qty 1
  Filled 2012-09-13: qty 2
  Filled 2012-09-13: qty 1
  Filled 2012-09-13: qty 2
  Filled 2012-09-13: qty 1
  Filled 2012-09-13: qty 2
  Filled 2012-09-13: qty 1

## 2012-09-13 SURGICAL SUPPLY — 39 items
BARRIER ADHS 3X4 INTERCEED (GAUZE/BANDAGES/DRESSINGS) ×2 IMPLANT
BENZOIN TINCTURE PRP APPL 2/3 (GAUZE/BANDAGES/DRESSINGS) IMPLANT
CLOTH BEACON ORANGE TIMEOUT ST (SAFETY) ×2 IMPLANT
CONTAINER PREFILL 10% NBF 15ML (MISCELLANEOUS) IMPLANT
DRAPE LG THREE QUARTER DISP (DRAPES) ×2 IMPLANT
DRSG OPSITE POSTOP 4X10 (GAUZE/BANDAGES/DRESSINGS) ×2 IMPLANT
DURAPREP 26ML APPLICATOR (WOUND CARE) ×2 IMPLANT
ELECT REM PT RETURN 9FT ADLT (ELECTROSURGICAL) ×2
ELECTRODE REM PT RTRN 9FT ADLT (ELECTROSURGICAL) ×1 IMPLANT
EXTRACTOR VACUUM KIWI (MISCELLANEOUS) IMPLANT
EXTRACTOR VACUUM M CUP 4 TUBE (SUCTIONS) IMPLANT
GLOVE BIO SURGEON STRL SZ 6.5 (GLOVE) ×2 IMPLANT
GLOVE BIOGEL PI IND STRL 7.0 (GLOVE) ×1 IMPLANT
GLOVE BIOGEL PI INDICATOR 7.0 (GLOVE) ×1
GOWN STRL REIN XL XLG (GOWN DISPOSABLE) ×4 IMPLANT
KIT ABG SYR 3ML LUER SLIP (SYRINGE) IMPLANT
NEEDLE HYPO 25X1 1.5 SAFETY (NEEDLE) ×2 IMPLANT
NEEDLE HYPO 25X5/8 SAFETYGLIDE (NEEDLE) IMPLANT
NS IRRIG 1000ML POUR BTL (IV SOLUTION) ×2 IMPLANT
PACK C SECTION WH (CUSTOM PROCEDURE TRAY) IMPLANT
PAD OB MATERNITY 4.3X12.25 (PERSONAL CARE ITEMS) ×2 IMPLANT
RTRCTR C-SECT PINK 25CM LRG (MISCELLANEOUS) IMPLANT
SLEEVE SCD COMPRESS KNEE MED (MISCELLANEOUS) IMPLANT
STAPLER VISISTAT 35W (STAPLE) ×2 IMPLANT
STRIP CLOSURE SKIN 1/2X4 (GAUZE/BANDAGES/DRESSINGS) IMPLANT
SUT CHROMIC GUT AB #0 18 (SUTURE) IMPLANT
SUT MNCRL 0 VIOLET CTX 36 (SUTURE) ×4 IMPLANT
SUT MON AB 4-0 PS1 27 (SUTURE) IMPLANT
SUT MONOCRYL 0 CTX 36 (SUTURE) ×4
SUT PLAIN 2 0 (SUTURE)
SUT PLAIN ABS 2-0 CT1 27XMFL (SUTURE) IMPLANT
SUT VIC AB 0 CT1 27 (SUTURE) ×2
SUT VIC AB 0 CT1 27XBRD ANBCTR (SUTURE) ×2 IMPLANT
SUT VIC AB 2-0 CT1 27 (SUTURE) ×1
SUT VIC AB 2-0 CT1 TAPERPNT 27 (SUTURE) ×1 IMPLANT
SYR CONTROL 10ML LL (SYRINGE) ×2 IMPLANT
TOWEL OR 17X24 6PK STRL BLUE (TOWEL DISPOSABLE) ×6 IMPLANT
TRAY FOLEY CATH 14FR (SET/KITS/TRAYS/PACK) IMPLANT
WATER STERILE IRR 1000ML POUR (IV SOLUTION) ×2 IMPLANT

## 2012-09-13 NOTE — Anesthesia Preprocedure Evaluation (Signed)
Anesthesia Evaluation  Patient identified by MRN, date of birth, ID band Patient awake    Reviewed: Allergy & Precautions, H&P , NPO status , Patient's Chart, lab work & pertinent test results, reviewed documented beta blocker date and time   History of Anesthesia Complications Negative for: history of anesthetic complications  Airway Mallampati: II TM Distance: >3 FB Neck ROM: full    Dental  (+) Teeth Intact   Pulmonary neg pulmonary ROS,  breath sounds clear to auscultation        Cardiovascular negative cardio ROS  Rhythm:regular Rate:Normal     Neuro/Psych Low back pain with pregnancy negative neurological ROS  negative psych ROS   GI/Hepatic negative GI ROS, Neg liver ROS, GERD- (with pregnancy, tums prn)  Controlled,  Endo/Other  negative endocrine ROS  Renal/GU negative Renal ROS  negative genitourinary   Musculoskeletal   Abdominal   Peds  Hematology negative hematology ROS (+)   Anesthesia Other Findings   Reproductive/Obstetrics (+) Pregnancy (h/o c/s x2)                           Anesthesia Physical Anesthesia Plan  ASA: II  Anesthesia Plan: Spinal   Post-op Pain Management:    Induction:   Airway Management Planned:   Additional Equipment:   Intra-op Plan:   Post-operative Plan:   Informed Consent: I have reviewed the patients History and Physical, chart, labs and discussed the procedure including the risks, benefits and alternatives for the proposed anesthesia with the patient or authorized representative who has indicated his/her understanding and acceptance.     Plan Discussed with: Surgeon and CRNA  Anesthesia Plan Comments:         Anesthesia Quick Evaluation

## 2012-09-13 NOTE — Transfer of Care (Signed)
Immediate Anesthesia Transfer of Care Note  Patient: Mary King  Procedure(s) Performed: Procedure(s) with comments: CESAREAN SECTION (N/A) - Repeat C/S  EDD: 09/20/12  Patient Location: PACU  Anesthesia Type:Spinal  Level of Consciousness: awake, alert  and oriented  Airway & Oxygen Therapy: Patient Spontanous Breathing  Post-op Assessment: Report given to PACU RN  Post vital signs: Reviewed  Complications: No apparent anesthesia complications

## 2012-09-13 NOTE — Anesthesia Postprocedure Evaluation (Signed)
  Anesthesia Post-op Note  Anesthesia Post Note  Patient: Mary King  Procedure(s) Performed: Procedure(s) (LRB): CESAREAN SECTION (N/A)  Anesthesia type: Spinal  Patient location: PACU  Post pain: Pain level controlled  Post assessment: Post-op Vital signs reviewed  Last Vitals:  Filed Vitals:   09/13/12 1000  BP: 117/80  Pulse: 94  Temp: 36.8 C  Resp: 20    Post vital signs: Reviewed  Level of consciousness: awake  Complications: No apparent anesthesia complications

## 2012-09-13 NOTE — Consult Note (Signed)
Neonatology Note:  Attendance at C-section:  I was asked by Dr. Cousins to attend this repeat C/S at term. 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 Antenatal counseling was done, especially in view of the following fetal ultrasound findings: thickened nuchal fold, echogenic bowel. ROM at delivery, fluid clear. Infant vigorous with good spontaneous cry and tone. Needed only minimal bulb suctioning. Ap 9/10. Lungs clear to ausc in DR. PE shows no dysmorphism, parents informed. To CN to care of Pediatrician.  Mary Usman C. [name], MD  

## 2012-09-13 NOTE — Anesthesia Postprocedure Evaluation (Signed)
  Anesthesia Post-op Note  Patient: Mary King  Procedure(s) Performed: Procedure(s) with comments: CESAREAN SECTION (N/A) - Repeat C/S  EDD: 09/20/12  Patient Location: Mother/Baby  Anesthesia Type:Spinal  Level of Consciousness: awake, alert  and oriented  Airway and Oxygen Therapy: Patient Spontanous Breathing  Post-op Pain: none  Post-op Assessment: Post-op Vital signs reviewed and Patient's Cardiovascular Status Stable  Post-op Vital Signs: Reviewed and stable  Complications: No apparent anesthesia complications

## 2012-09-13 NOTE — Anesthesia Procedure Notes (Signed)
Spinal  Patient location during procedure: OR Start time: 09/13/2012 7:55 AM Staffing Performed by: anesthesiologist  Preanesthetic Checklist Completed: patient identified, site marked, surgical consent, pre-op evaluation, timeout performed, IV checked, risks and benefits discussed and monitors and equipment checked Spinal Block Patient position: sitting Prep: site prepped and draped and DuraPrep Patient monitoring: heart rate, cardiac monitor, continuous pulse ox and blood pressure Approach: midline Location: L3-4 Injection technique: single-shot Needle Needle type: Sprotte  Needle gauge: 24 G Needle length: 9 cm Assessment Sensory level: T4 Additional Notes Clear free flow CSF on first attempt.  No paresthesia.  Patient tolerated procedure well with no apparent complications.  Jasmine December, MD

## 2012-09-13 NOTE — Brief Op Note (Signed)
09/13/2012  8:55 AM  PATIENT:  Mary King  32 y.o. female  PRE-OPERATIVE DIAGNOSIS:  Previous Cesarean Section x 2, Term gestation  POST-OPERATIVE DIAGNOSIS:  Previous Cesarean Section x 2, Term gestation  PROCEDURE:  Repeat Cesarean section, [name] Hysterotomy  SURGEON:  Surgeon(s) and Role:    * Serita Kyle, MD - Primary  PHYSICIAN ASSISTANT:   ASSISTANTS: Teressa Lower, CNM                          Marlinda Mike, CNM   ANESTHESIA:   spinal FINDINGS; live female nl tubes and ovaries, Apgar 9/10  EBL:  Total I/O In: 2000 [I.V.:2000] Out: 1000 [Urine:300; Blood:700]  BLOOD ADMINISTERED:none  DRAINS: none   LOCAL MEDICATIONS USED:  MARCAINE     SPECIMEN:  Source of Specimen:  Placenta  DISPOSITION OF SPECIMEN:  N/A  COUNTS:  YES  TOURNIQUET:  * No tourniquets in log *  DICTATION: .Other Dictation: Dictation Number [id number]  PLAN OF CARE: Admit to inpatient   PATIENT DISPOSITION:  PACU - hemodynamically stable.   Delay start of Pharmacological VTE agent (>24hrs) due to surgical blood loss or risk of bleeding: no

## 2012-09-13 NOTE — Preoperative (Signed)
Beta Blockers   Reason not to administer Beta Blockers:Not Applicable 

## 2012-09-14 LAB — CBC
MCHC: 32.5 g/dL (ref 30.0–36.0)
MCV: 101.6 fL — ABNORMAL HIGH (ref 78.0–100.0)
Platelets: 217 10*3/uL (ref 150–400)
RDW: 13.5 % (ref 11.5–15.5)
WBC: 11.4 10*3/uL — ABNORMAL HIGH (ref 4.0–10.5)

## 2012-09-14 NOTE — Op Note (Signed)
Mary King, WACHTER            ACCOUNT NO.:  [id number]  MEDICAL RECORD NO.:  [id number]  LOCATION:  9147                          FACILITY:  [facility]  PHYSICIAN:  Maxie Better, M.D.DATE OF BIRTH:  [birth year]  DATE OF PROCEDURE:  09/13/2012 DATE OF DISCHARGE:                              OPERATIVE REPORT   PREOPERATIVE DIAGNOSIS:  Previous cesarean section x2, term gestation.  PROCEDURE:  Repeat cesarean section, Kerr hysterotomy.  POSTOPERATIVE DIAGNOSES:  Previous cesarean section x2, term gestation.  ANESTHESIA:  Spinal.  SURGEON:  Maxie Better, M.D.  ASSISTANT:  Dr. Luther Parody and Marlinda Mike, C.N.M.  DESCRIPTION OF PROCEDURE:  Under adequate spinal anesthesia, the patient was placed in the supine position with a left lateral tilt.  She was sterilely prepped and draped in the usual fashion.  An indwelling Foley catheter was sterilely placed.  A 0.25% Marcaine was injected along the previous Pfannenstiel skin incision.  Pfannenstiel skin incision was then made and carried down to the rectus fascia.  The rectus fascia was opened transversely.  The rectus fascia was then sharply and bluntly dissected off the rectus muscle in a superior and inferior fashion.  The superior aspect was plastered against the rectus muscle.  Sharp dissection was primarily done.  It was then noted that a portion of the rectus fascia was still attached to the muscle and that was undermined and opened allowing for further superior extension of the dissection of the rectus fascia off the rectus muscle.  The rectus muscle had a partial opening and that was extended using cautery.  It was then noted that the parietal peritoneum was incidentally entered at that point. The bladder was slightly high in the pelvis due to the previous surgery. Careful dissection was continued superiorly in order to get exposure and the parietal peritoneum was opened superiorly and extended further. Once this  was sufficient, the bladder blade was placed.  There was a lower uterine segment partial thinness but more so adhesions of the bladder peritoneum.  This was carefully opened transversely.  The bladder was limited in terms of its mobility and being displaced inferiorly.  A curvilinear uterine incision was made above the bladder and  extended.  Copious clear amniotic fluid was noted.  A floating vertex was encountered.  The incision had been extended.  Subsequent delivery of a live female from the left occiput transverse position was accomplished.  The baby was bulb suctioned from the abdomen.  The cord was clamped and cut.  The baby was transferred to the awaiting pediatrician who assigned Apgars of 9 and 10 at 1 and 5 minutes.  The placenta which was posterior was manually removed.  Uterine cavity was cleaned of debris.  Uterine incision had no extension.  However, the bladder peritoneum was still adherent in the inferior aspect of the incision.  Sharp dissection was then carefully performed with the bladder being displaced further inferiorly resulting in the uterine incision was able to then be closed in 2 layers, the first layer with 0 Monocryl running locked stitch and second layer was imbricated using 0 Monocryl sutures.  Normal tubes and ovaries were noted bilaterally.  The incision had good hemostasis.  Abdomen was then  copiously irrigated and suctioned.  The parietal peritoneum was then closed carefully with 2-0 Vicryl suture.  The rectus fascia which was separately had been split was closed with 0 Monocryl suture in a vertical fashion, and the defect in the fascia was then closed carefully.  Once that was done, the rectus fascia was then closed with 0 Vicryl x2.  The subcutaneous area was irrigated.  Small bleeders cauterized.  Interrupted 2-0 plain sutures placed and the skin approximated with Ethicon staples.  SPECIMEN:  Placenta not sent to Pathology.  ESTIMATED BLOOD LOSS:   700 mL.  INTRAOPERATIVE FLUID:  2700 mL.  URINE OUTPUT:  300 mL clear yellow urine.  COUNTS:  Sponge and instrument counts x2 was correct.  COMPLICATIONS:  None.  CONDITION:  The patient tolerated the procedure well and was transferred to recovery in stable condition.     Maxie Better, M.D.     [place]/MEDQ  D:  09/13/2012  T:  09/14/2012  Job:  [id number].

## 2012-09-14 NOTE — Progress Notes (Signed)
Patient ID: Mary King, female   DOB: 1980/09/22, 32 y.o.   MRN: [id number] POD # 1  Subjective: Pt reports feeling well/ Pain controlled with ibuprofen and percocet Tolerating po/ Foley d/c'ed/Voiding without problems/ No n/v/Flatus pos Activity: out of bed and ambulate Bleeding is light Newborn info:  Information for the patient's newborn:  Saddie, Sandeen [[id number]]  female  / circ: completed this am per Dr Cousins/ Feeding: breast   Objective: VS: Blood pressure 118/86, pulse 99, temperature 98.1 F (36.7 C), temperature source Oral, resp. rate 16.    Intake/Output Summary (Last 24 hours) at 09/14/12 1052 Last data filed at 09/14/12 0400  Gross per 24 hour  Intake   1450 ml  Output   3550 ml  Net  -2100 ml      Recent Labs  09/12/12 1600 09/14/12 0605  WBC 10.2 11.4*  HGB 11.4* 10.3*  HCT 34.5* 31.7*  PLT 248 217    Blood type: --/--/A POS, A POS (03/17 1600) Rubella: Immune (08/22 0000)    Physical Exam:  General: alert, cooperative and no distress CV: Regular rate and rhythm Resp: clear Abdomen: soft, nontender, normal bowel sounds Incision: well approximated, closed with subcuticular closure; tegaderm and honeycomb intact Uterine Fundus: firm, below umbilicus, nontender Lochia: minimal Ext: edema trace and Homans sign is negative, no sign of DVT    A/P: POD # 1/ [id number] S/P C/Section d/t elective repeat Mild ABL anemia; on fe supplement at present Doing well and desires early d/c home tomorrow. Continue routine post op orders   Signed: Demetrius Revel, MSN, [facility] 09/14/2012, 10:52 AM

## 2012-09-15 ENCOUNTER — Encounter (HOSPITAL_COMMUNITY): Payer: Self-pay | Admitting: Obstetrics and Gynecology

## 2012-09-15 DIAGNOSIS — O9902 Anemia complicating childbirth: Secondary | ICD-10-CM | POA: Diagnosis present

## 2012-09-15 MED ORDER — IBUPROFEN 600 MG PO TABS: 600.0000 mg | ORAL_TABLET | Freq: Four times a day (QID) | ORAL | Status: DC | PRN

## 2012-09-15 MED ORDER — FERROUS SULFATE 325 (65 FE) MG PO TABS: 325.0000 mg | ORAL_TABLET | Freq: Two times a day (BID) | ORAL | Status: DC

## 2012-09-15 MED ORDER — OXYCODONE-ACETAMINOPHEN 5-325 MG PO TABS: 2.0000 | ORAL_TABLET | Freq: Four times a day (QID) | ORAL | Status: DC | PRN

## 2012-09-15 MED ORDER — SIMETHICONE 80 MG PO CHEW: 80.0000 mg | CHEWABLE_TABLET | Freq: Three times a day (TID) | ORAL | Status: DC

## 2012-09-15 NOTE — Discharge Summary (Signed)
Physician Discharge Summary  Patient ID: Mary King MRN: [id number] DOB/AGE: 32-Jan-1982 32 y.o.  Admit date: 09/13/2012 Discharge date: 09/15/2012  Admission Diagnoses: Scheduled RLTCS Dr Cherly Hensen at [redacted]w[redacted]d   Discharge Diagnoses:  Principal Problem:   Postpartum care following cesarean delivery Active Problems:   Status post repeat low transverse cesarean section   Anemia of mother in pregnancy, delivered   Discharged Condition: stable  Hospital Course: Scheduled RLTCS with Dr Cherly Hensen. Uncomplicated hospital course RLTCS viable female infant/ circumcision performed on newborn D1 post delivery.  Consults: None Significant Diagnostic Studies: labs: PN Regular labs normal and radiology: Ultrasound: anatomy, IV hydration  Treatments: antibiotics: Ancef and analgesia: acetaminophen w/ codeine and ibuprofen  Discharge Exam: Blood pressure 131/89, pulse 92, temperature 98.4 F (36.9 C), temperature source Oral, resp. rate 20, height 5\' 5"  (1.651 m), weight 82.555 kg (182 lb), last menstrual period 12/15/2011, SpO2 98.00%, unknown if currently breastfeeding.  Physical Examination: General appearance: alert, cooperative and no distress Affect: AAO x 3 Lungs: CTAB Breasts: NT CV: RRR Abdomen: Soft, N/T, B/s x 4 Fundus: -2/u Incision: Dry and Intact  - honeycomb/clear dressing remains until POD #6 for staple removal at the office. GI: normal with flatus and BM x 2 GU: no problems voiding Lochia: min, rubra Extremities: No swelling/ edema upper and lower limbs bilaterally, homans' neg bilaterally.    Disposition:   Discharge Orders   Future Orders Complete By Expires     Activity as tolerated  As directed     Change dressing (specify)  As directed     Comments:      Dressing change: at office x 6 days    Diet general  As directed     Discharge instructions  As directed     Comments:      Per [facility] Booklet    Driving restriction   As directed     Comments:      Avoid driving for at least 2 weeks approx    Leave dressing on - Keep it clean, dry, and intact until clinic visit  As directed     Lifting restrictions  As directed     Comments:      Weight restriction of 20 lbs.        Medication List    TAKE these medications       acetaminophen 500 MG tablet  Commonly known as:  TYLENOL  Take 1,000 mg by mouth every 6 (six) hours as needed for pain (back Pain).     calcium carbonate 500 MG chewable tablet  Commonly known as:  TUMS - dosed in mg elemental calcium  Chew 2 tablets by mouth daily as needed for heartburn.     ferrous sulfate 325 (65 FE) MG tablet  Take 1 tablet (325 mg total) by mouth 2 (two) times daily with a meal.     ibuprofen 600 MG tablet  Commonly known as:  ADVIL,MOTRIN  Take 1 tablet (600 mg total) by mouth every 6 (six) hours as needed.     multivitamin-prenatal 27-0.8 MG Tabs  Take 1 tablet by mouth at bedtime.     oxyCODONE-acetaminophen 5-325 MG per tablet  Commonly known as:  PERCOCET/ROXICET  Take 2 tablets by mouth every 6 (six) hours as needed.     simethicone 80 MG chewable tablet  Commonly known as:  MYLICON  Chew 1 tablet (80 mg total) by mouth 4 (four) times daily - after meals and at bedtime.  Follow-up Information   Follow up with [facility] In 6 days. (As needed/ will need staple removal x 6days post op)    Contact information:   [address] [place] [address] [phone]      Signed: Earl Gala 09/15/2012, 12:30 PM

## 2012-09-15 NOTE — Progress Notes (Signed)
POSTOPERATIVE DAY #2 /P scheduled RLTCS   S:         Reports feeling well             Tolerating po intake  /  Vomiting/ nausea / flatus and BM x 2             Bleeding is light             Pain controlled withibuprofen (OTC) and narcotic analgesics including oxycodone/acetaminophen (Percocet, Tylox)             Up ad lib / ambulatory/ voiding QS  Newborn breast feeding and Circumcision completed   O:  VS: BP 131/89  Pulse 92  Temp(Src) 98.4 F (36.9 C) (Oral)  Resp 20  Ht 5\' 5"  (1.651 m)  Wt 82.555 kg (182 lb)  BMI 30.29 kg/m2  SpO2 98%  LMP 12/15/2011   LABS:  Recent Labs  09/12/12 1600 09/14/12 0605  WBC 10.2 11.4*  HGB 11.4* 10.3*  PLT 248 217   Anemia of pregnancy - delivered                         I&O: Intake/Output     03/19 0701 - 03/20 0700 03/20 0701 - 03/21 0700   P.O.     I.V. (mL/kg)     Total Intake(mL/kg)     Urine (mL/kg/hr)     Blood     Total Output       Net                         Physical Exam:             Alert and Oriented X3  Lungs: Clear and unlabored  Heart: regular rate and rhythm / no mumurs  Abdomen: soft, non-tender, non-distended / B/ x 4  With flatus and BM x 2             Fundus: firm, non-tender, U-2             Dressing: clear Honeycomb dressing remains in place intact. Patient has been advised to return to office for                   Staple removal x 6 days.             Incision:  approximated with staples No erythema/ecchymosis  Perineum: intact  Lochia: min, rubra  Extremities: no edema/ calf pain/ neg Homans  A:        POD # 2S/P scheduled RLTCS            Uncomplicated hospital course  P:        Routine postoperative care              Plan for early discharge today POD #2             Baby has been discharged     Rogene Meth CNM. 09/15/2012, 12:10 PM

## 2012-11-16 ENCOUNTER — Other Ambulatory Visit: Payer: Self-pay | Admitting: Obstetrics and Gynecology

## 2012-11-17 ENCOUNTER — Encounter (HOSPITAL_COMMUNITY): Payer: Self-pay | Admitting: *Deleted

## 2012-11-18 ENCOUNTER — Ambulatory Visit (HOSPITAL_COMMUNITY): Payer: BC Managed Care – PPO | Admitting: Anesthesiology

## 2012-11-18 ENCOUNTER — Encounter (HOSPITAL_COMMUNITY): Payer: Self-pay | Admitting: Anesthesiology

## 2012-11-18 ENCOUNTER — Encounter (HOSPITAL_COMMUNITY): Payer: Self-pay | Admitting: *Deleted

## 2012-11-18 ENCOUNTER — Encounter (HOSPITAL_COMMUNITY)
Admission: RE | Disposition: A | Discharge status: Home / Self Care | Payer: Self-pay | Source: Ambulatory Visit | Attending: Obstetrics and Gynecology

## 2012-11-18 ENCOUNTER — Ambulatory Visit (HOSPITAL_COMMUNITY)
Admission: RE | Admit: 2012-11-18 | Discharge: 2012-11-18 | Disposition: A | Discharge status: Home / Self Care | Payer: BC Managed Care – PPO | Source: Ambulatory Visit | Attending: Obstetrics and Gynecology | Admitting: Obstetrics and Gynecology

## 2012-11-18 DIAGNOSIS — N938 Other specified abnormal uterine and vaginal bleeding: Secondary | ICD-10-CM | POA: Insufficient documentation

## 2012-11-18 DIAGNOSIS — N949 Unspecified condition associated with female genital organs and menstrual cycle: Secondary | ICD-10-CM | POA: Insufficient documentation

## 2012-11-18 DIAGNOSIS — N9489 Other specified conditions associated with female genital organs and menstrual cycle: Secondary | ICD-10-CM | POA: Insufficient documentation

## 2012-11-18 HISTORY — PX: DILATATION & CURRETTAGE/HYSTEROSCOPY WITH RESECTOCOPE: SHX5572

## 2012-11-18 LAB — CBC
HCT: 38.6 % (ref 36.0–46.0)
Hemoglobin: 12.7 g/dL (ref 12.0–15.0)
MCHC: 32.9 g/dL (ref 30.0–36.0)
WBC: 6.6 10*3/uL (ref 4.0–10.5)

## 2012-11-18 SURGERY — DILATATION & CURETTAGE/HYSTEROSCOPY WITH RESECTOCOPE
Anesthesia: General | Site: Vagina | Wound class: Clean Contaminated

## 2012-11-18 MED ORDER — GLYCOPYRROLATE 0.2 MG/ML IJ SOLN
INTRAMUSCULAR | Status: AC
  Filled 2012-11-18: qty 1

## 2012-11-18 MED ORDER — ONDANSETRON HCL 4 MG/2ML IJ SOLN
INTRAMUSCULAR | Status: AC
  Filled 2012-11-18: qty 2

## 2012-11-18 MED ORDER — OXYCODONE-ACETAMINOPHEN 5-325 MG PO TABS
ORAL_TABLET | ORAL | Status: AC
  Filled 2012-11-18: qty 1

## 2012-11-18 MED ORDER — PROPOFOL 10 MG/ML IV BOLUS
INTRAVENOUS | Status: DC | PRN
  Administered 2012-11-18: 200 mg via INTRAVENOUS

## 2012-11-18 MED ORDER — KETOROLAC TROMETHAMINE 30 MG/ML IJ SOLN
INTRAMUSCULAR | Status: AC
  Filled 2012-11-18: qty 1

## 2012-11-18 MED ORDER — PHENYLEPHRINE 40 MCG/ML (10ML) SYRINGE FOR IV PUSH (FOR BLOOD PRESSURE SUPPORT)
PREFILLED_SYRINGE | INTRAVENOUS | Status: AC
  Filled 2012-11-18: qty 5

## 2012-11-18 MED ORDER — GLYCOPYRROLATE 0.2 MG/ML IJ SOLN
INTRAMUSCULAR | Status: DC | PRN
  Administered 2012-11-18: 0.2 mg via INTRAVENOUS

## 2012-11-18 MED ORDER — OXYCODONE-ACETAMINOPHEN 5-325 MG PO TABS
1.0000 | ORAL_TABLET | Freq: Once | ORAL | Status: AC
  Administered 2012-11-18: 1 via ORAL

## 2012-11-18 MED ORDER — GLYCINE 1.5 % IR SOLN
Status: DC | PRN
  Administered 2012-11-18 (×2): 3000 mL

## 2012-11-18 MED ORDER — DEXAMETHASONE SODIUM PHOSPHATE 10 MG/ML IJ SOLN
INTRAMUSCULAR | Status: AC
  Filled 2012-11-18: qty 1

## 2012-11-18 MED ORDER — FENTANYL CITRATE 0.05 MG/ML IJ SOLN
INTRAMUSCULAR | Status: AC
  Administered 2012-11-18: 50 ug via INTRAVENOUS
  Filled 2012-11-18: qty 2

## 2012-11-18 MED ORDER — CEFAZOLIN SODIUM-DEXTROSE 2-3 GM-% IV SOLR: 2.0000 g | INTRAVENOUS | Status: DC

## 2012-11-18 MED ORDER — DEXAMETHASONE SODIUM PHOSPHATE 4 MG/ML IJ SOLN
INTRAMUSCULAR | Status: DC | PRN
  Administered 2012-11-18: 10 mg via INTRAVENOUS

## 2012-11-18 MED ORDER — LACTATED RINGERS IV SOLN
INTRAVENOUS | Status: DC
  Administered 2012-11-18: 50 mL/h via INTRAVENOUS
  Administered 2012-11-18 (×2): via INTRAVENOUS

## 2012-11-18 MED ORDER — MIDAZOLAM HCL 5 MG/5ML IJ SOLN
INTRAMUSCULAR | Status: DC | PRN
  Administered 2012-11-18: 2 mg via INTRAVENOUS

## 2012-11-18 MED ORDER — MIDAZOLAM HCL 2 MG/2ML IJ SOLN
INTRAMUSCULAR | Status: AC
  Filled 2012-11-18: qty 2

## 2012-11-18 MED ORDER — CHLOROPROCAINE HCL 1 % IJ SOLN
INTRAMUSCULAR | Status: DC | PRN
  Administered 2012-11-18: 20 mL

## 2012-11-18 MED ORDER — ONDANSETRON HCL 4 MG/2ML IJ SOLN
INTRAMUSCULAR | Status: DC | PRN
  Administered 2012-11-18: 4 mg via INTRAVENOUS

## 2012-11-18 MED ORDER — FENTANYL CITRATE 0.05 MG/ML IJ SOLN
INTRAMUSCULAR | Status: DC | PRN
  Administered 2012-11-18: 100 ug via INTRAVENOUS

## 2012-11-18 MED ORDER — FENTANYL CITRATE 0.05 MG/ML IJ SOLN
25.0000 ug | INTRAMUSCULAR | Status: DC | PRN
  Administered 2012-11-18: 50 ug via INTRAVENOUS

## 2012-11-18 MED ORDER — CHLOROPROCAINE HCL 1 % IJ SOLN
INTRAMUSCULAR | Status: AC
  Filled 2012-11-18: qty 30

## 2012-11-18 MED ORDER — FENTANYL CITRATE 0.05 MG/ML IJ SOLN
INTRAMUSCULAR | Status: AC
  Filled 2012-11-18: qty 2

## 2012-11-18 MED ORDER — KETOROLAC TROMETHAMINE 30 MG/ML IJ SOLN
INTRAMUSCULAR | Status: DC | PRN
  Administered 2012-11-18: 30 mg via INTRAMUSCULAR
  Administered 2012-11-18: 30 mg via INTRAVENOUS

## 2012-11-18 MED ORDER — LIDOCAINE HCL (CARDIAC) 20 MG/ML IV SOLN
INTRAVENOUS | Status: AC
  Filled 2012-11-18: qty 5

## 2012-11-18 MED ORDER — CEFAZOLIN SODIUM-DEXTROSE 2-3 GM-% IV SOLR
INTRAVENOUS | Status: AC
  Administered 2012-11-18: 2 g via INTRAVENOUS
  Filled 2012-11-18: qty 50

## 2012-11-18 MED ORDER — LIDOCAINE HCL (CARDIAC) 20 MG/ML IV SOLN
INTRAVENOUS | Status: DC | PRN
  Administered 2012-11-18: 100 mg via INTRAVENOUS

## 2012-11-18 MED ORDER — PROPOFOL 10 MG/ML IV EMUL
INTRAVENOUS | Status: AC
  Filled 2012-11-18: qty 20

## 2012-11-18 SURGICAL SUPPLY — 18 items
CANISTER SUCTION 2500CC (MISCELLANEOUS) ×2 IMPLANT
CATH ROBINSON RED A/P 16FR (CATHETERS) ×2 IMPLANT
CLOTH BEACON ORANGE TIMEOUT ST (SAFETY) ×2 IMPLANT
CONTAINER PREFILL 10% NBF 60ML (FORM) IMPLANT
DRESSING TELFA 8X3 (GAUZE/BANDAGES/DRESSINGS) ×2 IMPLANT
ELECT REM PT RETURN 9FT ADLT (ELECTROSURGICAL) ×2
ELECTRODE REM PT RTRN 9FT ADLT (ELECTROSURGICAL) ×1 IMPLANT
ELECTRODE ROLLER VERSAPOINT (ELECTRODE) IMPLANT
ELECTRODE RT ANGLE VERSAPOINT (CUTTING LOOP) IMPLANT
GLOVE BIO SURGEON STRL SZ 6.5 (GLOVE) ×2 IMPLANT
GLOVE BIOGEL PI IND STRL 7.0 (GLOVE) ×1 IMPLANT
GLOVE BIOGEL PI INDICATOR 7.0 (GLOVE) ×1
GOWN STRL REIN XL XLG (GOWN DISPOSABLE) ×4 IMPLANT
LOOP ANGLED CUTTING 22FR (CUTTING LOOP) ×2 IMPLANT
PACK HYSTEROSCOPY LF (CUSTOM PROCEDURE TRAY) ×2 IMPLANT
PAD OB MATERNITY 4.3X12.25 (PERSONAL CARE ITEMS) ×2 IMPLANT
TOWEL OR 17X24 6PK STRL BLUE (TOWEL DISPOSABLE) ×4 IMPLANT
WATER STERILE IRR 1000ML POUR (IV SOLUTION) ×2 IMPLANT

## 2012-11-18 NOTE — Brief Op Note (Signed)
11/18/2012  3:36 PM  PATIENT:  Mary King  32 y.o. female  PRE-OPERATIVE DIAGNOSIS:  Endometrial Mass, Postpartum Bleeding ( DUB), S/P repeat cesarean section  POST-OPERATIVE DIAGNOSIS:  Endometrial Mass, Postpartum Bleeding (DUB). S/p repeat cesarean section  PROCEDURE:  Diagnostic hysteroscopy,  Hysteroscopic resection of endometrial mass, dilation and currettage  SURGEON:  Surgeon(s) and Role:    * Toribio Seiber A Rhylin Venters, MD - Primary  PHYSICIAN ASSISTANT:   ASSISTANTS: none   ANESTHESIA:   general and paracervical block Findings: endom mass attached to fundal area, nl tubal ostia, / subtle SM fibroid posterior EBL:  Total I/O In: 1000 [I.V.:1000] Out: 80 [Urine:50; Blood:30]  BLOOD ADMINISTERED:none  DRAINS: none   LOCAL MEDICATIONS USED:  OTHER nesicaine   SPECIMEN:  Source of Specimen:  emc w poss retained tissue  DISPOSITION OF SPECIMEN:  PATHOLOGY  COUNTS:  YES  TOURNIQUET:  * No tourniquets in log *  DICTATION: .Other Dictation: Dictation Number [id number]  PLAN OF CARE: Discharge to home after PACU  PATIENT DISPOSITION:  PACU - hemodynamically stable.   Delay start of Pharmacological VTE agent (>24hrs) due to surgical blood loss or risk of bleeding: no

## 2012-11-18 NOTE — H&P (Signed)
Marguita L Gebel is an 32 y.o. female [phone] MWF s/p R C/S(3/18) presents for evaluation of surgical mgmt of fundal endometrial mass found on sonohysterogram for DUB  Pertinent Gynecological History: C/S x3 Menstrual History No LMP recorded.    Past Medical History  Diagnosis Date  . Compartment syndrome of lower extremity 2004  . GERD (gastroesophageal reflux disease)     with pregnancy  . Headache     Past Surgical History  Procedure Laterality Date  . Cesarean section  2011,2007  . Arthroscopic repair acl  [id number]  . Tonsillectomy  2001  . Anterior compartment decompression  2003  . Cesarean section N/A 09/13/2012    Procedure: CESAREAN SECTION;  Surgeon: Serita Kyle, MD;  Location: [facility];  Service: Obstetrics;  Laterality: N/A;  Repeat C/S  EDD: 09/20/12    History reviewed. No pertinent family history.  Social History:  reports that she has never smoked. She does not have any smokeless tobacco history on file. She reports that she does not drink alcohol or use illicit drugs.  Allergies:  Allergies  Allergen Reactions  . Codeine Nausea Only    Prescriptions prior to admission  Medication Sig Dispense Refill  . ibuprofen (ADVIL,MOTRIN) 600 MG tablet Take 1 tablet (600 mg total) by mouth every 6 (six) hours as needed.  30 tablet  2  . acetaminophen (TYLENOL) 500 MG tablet Take 1,000 mg by mouth every 6 (six) hours as needed for pain (back Pain).      . calcium carbonate (TUMS - DOSED IN MG ELEMENTAL CALCIUM) 500 MG chewable tablet Chew 2 tablets by mouth daily as needed for heartburn.      . ferrous sulfate 325 (65 FE) MG tablet Take 1 tablet (325 mg total) by mouth 2 (two) times daily with a meal.  30 tablet  4  . oxyCODONE-acetaminophen (PERCOCET/ROXICET) 5-325 MG per tablet Take 2 tablets by mouth every 6 (six) hours as needed.  30 tablet  0  . Prenatal Vit-Fe Fumarate-FA (MULTIVITAMIN-PRENATAL) 27-0.8 MG TABS Take 1 tablet by mouth at bedtime.      .  simethicone (MYLICON) 80 MG chewable tablet Chew 1 tablet (80 mg total) by mouth 4 (four) times daily - after meals and at bedtime.  30 tablet  4    Review of Systems  Genitourinary:       Abnormal vaginal bleeding  All other systems reviewed and are negative.    currently breastfeeding. Physical Exam  Constitutional: She is oriented to person, place, and time. She appears well-developed and well-nourished.  HENT:  Head: Atraumatic.  Neck: Neck supple.  Cardiovascular: Regular rhythm.   Genitourinary: Vagina normal.  Musculoskeletal: Normal range of motion.  Neurological: She is alert and oriented to person, place, and time.  Skin: Skin is warm and dry.  Psychiatric: She has a normal mood and affect.    No results found for this or any previous visit (from the past 24 hour(s)).  No results found.  Assessment/Plan: DUB Endometrial mass P) hysteroscopic resection of endometrial mass, D&C, dx hysteroscopy. Risk of procedure explained including but not limited to infection, bleeding, injury to surrounding organ structures, uterine perforation and its mgmt. ALL ? answered  Clydell Alberts A 11/18/2012, 12:26 PM

## 2012-11-18 NOTE — Anesthesia Postprocedure Evaluation (Signed)
  Anesthesia Post-op Note  Patient: Mary King  Procedure(s) Performed: Procedure(s): DILATATION & CURETTAGE/HYSTEROSCOPY WITH RESECTOCOPE (N/A)  Patient Location: PACU  Anesthesia Type:General  Level of Consciousness: awake, alert  and oriented  Airway and Oxygen Therapy: Patient Spontanous Breathing  Post-op Pain: none  Post-op Assessment: Post-op Vital signs reviewed, Patient's Cardiovascular Status Stable, Respiratory Function Stable, Patent Airway, No signs of Nausea or vomiting and Pain level controlled  Post-op Vital Signs: Reviewed and stable  Complications: No apparent anesthesia complications

## 2012-11-18 NOTE — Anesthesia Preprocedure Evaluation (Signed)

## 2012-11-18 NOTE — Transfer of Care (Signed)
Immediate Anesthesia Transfer of Care Note  Patient: Mary King  Procedure(s) Performed: Procedure(s): DILATATION & CURETTAGE/HYSTEROSCOPY WITH RESECTOCOPE (N/A)  Patient Location: PACU  Anesthesia Type:General  Level of Consciousness: awake  Airway & Oxygen Therapy: Patient Spontanous Breathing  Post-op Assessment: Report given to PACU RN  Post vital signs: stable  Filed Vitals:   11/18/12 1226  BP: 129/82  Pulse: 90  Temp: 37 C  Resp: 18    Complications: No apparent anesthesia complications

## 2012-11-18 NOTE — Anesthesia Procedure Notes (Signed)
Procedure Name: LMA Insertion Date/Time: 11/18/2012 2:33 PM Performed by: Dafne Nield, Jannet Askew Pre-anesthesia Checklist: Patient identified, Timeout performed, Emergency Drugs available, Suction available and Patient being monitored Oxygen Delivery Method: Circle system utilized Intubation Type: IV induction LMA Size: 4.0 Tube size: 4.0 mm Number of attempts: 3 Tube secured with: at lips. Dental Injury: Teeth and Oropharynx as per pre-operative assessment

## 2012-11-18 NOTE — Op Note (Signed)
NAMEKAEDEN, DEPAZ            ACCOUNT NO.:  [id number]  MEDICAL RECORD NO.:  [id number]  LOCATION:  [facility]                          FACILITY:  [facility]  PHYSICIAN:  Maxie Better, M.D.DATE OF BIRTH:  April 24, 1981  DATE OF PROCEDURE:  11/18/2012 DATE OF DISCHARGE:  11/18/2012                              OPERATIVE REPORT   PREOPERATIVE DIAGNOSIS:  Dysfunctional uterine bleeding, endometrial mass, status post repeat cesarean section.  PROCEDURE:  Diagnostic hysteroscopy, hysteroscopic resection of endometrial mass, dilation and curettage.  POSTOPERATIVE DIAGNOSIS:  Dysfunctional uterine bleeding, endometrial mass, status post repeat cesarean section.  ANESTHESIA:  General, paracervical block.  SURGEON:  Maxie Better, MD.  ASSISTANT:  None.  DESCRIPTION OF PROCEDURE:  Under adequate general anesthesia, the patient was placed in a dorsal lithotomy position.  She was sterilely prepped and draped in usual fashion.  The bladder was catheterized for moderate amount of urine.  Examination under anesthesia revealed an anteverted uterus.  No adnexal masses could be appreciated.  A bivalve speculum was placed in vagina.  Single-tooth tenaculum was placed on the anterior lip of the cervix.  A 20 mL of 1% Nesacaine was injected paracervically at 3 o'clock and 9 o'clock position.  The cervix was then serially dilated up to a #29 Pratt dilator.  A glycine primed resectoscope was introduced into the uterine cavity without incident. Fluffiness and lacy material was noted.  There was also focal pedunculated lesion also noted in the fundal region.  The tubal ostia on both side could be seen.  Using the resectoscope, the mass in the fundal region was carefully resected.  Small areas appeared to be possibly submucosal fibroids posteriorly was also resected.  The resectoscope removed and the cavity gently curetted.  The resectoscope was then reinserted.  No additional lesions were noted.   Specimen labeled endometrial curettings with possible retained tissue was sent to Pathology.  Estimated blood loss was 30 mL. Intraoperative fluids 1500 mL.  Fluid deficit 250 mL.  Sponge and instrument counts x2 was correct.  COMPLICATION:  None.  The patient tolerated procedure well, was transferred to recovery in stable condition.    Maxie Better, M.D.    [place]/MEDQ  D:  11/18/2012  T:  11/18/2012  Job:  [id number]

## 2012-11-21 ENCOUNTER — Encounter (HOSPITAL_COMMUNITY): Payer: Self-pay | Admitting: Obstetrics and Gynecology

## 2014-04-30 ENCOUNTER — Encounter (HOSPITAL_COMMUNITY): Payer: Self-pay | Admitting: Obstetrics and Gynecology

## 2014-07-27 ENCOUNTER — Ambulatory Visit (INDEPENDENT_AMBULATORY_CARE_PROVIDER_SITE_OTHER): Payer: BLUE CROSS/BLUE SHIELD | Admitting: Neurology

## 2014-07-27 ENCOUNTER — Encounter: Payer: Self-pay | Admitting: Neurology

## 2014-07-27 VITALS — BP 137/97 | HR 102 | Ht 65.0 in | Wt 162.0 lb

## 2014-07-27 DIAGNOSIS — R202 Paresthesia of skin: Secondary | ICD-10-CM

## 2014-07-27 DIAGNOSIS — M542 Cervicalgia: Secondary | ICD-10-CM

## 2014-07-27 NOTE — Progress Notes (Signed)
PATIENT: Mary King DOB: 1980/12/25  HISTORICAL  Mary King is a 34 yo RH female, referred by Dr. Ethelene Halamos, and her primary care physician Dr. Kevan NyGates for evaluation of right-sided numbness  She had a history of bilateral lower extremity compartment syndrome while playing soccer in her teens, had compartment decompression surgery, since 2013, was seen by Dr. Ethelene Halamos for left-sided neck pain, radiating pain to left upper extremity, per patient, [facility] showed multilevel cervical degenerative disc disease, possible left C6 root compression, in 2015, she received multiple cervical epidural injection, for her left neck pain, upper lowe back pain, which was helpful  In 2013, during her most recent pregnancy, she noticed right facial numbness, lasting for few weeks, gradually disappeared,  Since October 2015, she noticed recurrent right facial numbness, which has been persistent, she denies right body involvement, no weakness, she has mild left arm weakness, attributed to her left cervical radiculopathy, she denies gait difficulty, denies visual loss, no bowel or bladder incontinence.  She also complains of frequent headaches, spreading forward from her neck pain, settled as the right retro-orbital area moderate to severe pulmonary headaches,  REVIEW OF SYSTEMS: Full 14 system review of systems performed and notable only for headaches, numbness, weakness, anxiety ALLERGIES: Allergies  Allergen Reactions  . Codeine Nausea Only    HOME MEDICATIONS: Current Outpatient Prescriptions  Medication Sig Dispense Refill  . acetaminophen (TYLENOL) 500 MG tablet Take 1,000 mg by mouth every 6 (six) hours as needed for pain (back Pain).    . cetirizine (ZYRTEC) 10 MG tablet Take 10 mg by mouth daily.    Marland Kitchen. HYDROcodone-acetaminophen (NORCO/VICODIN) 5-325 MG per tablet Take 1 tablet by mouth 3 (three) times daily as needed.  0  . ibuprofen (ADVIL,MOTRIN) 600  MG tablet Take 1 tablet (600 mg total) by mouth every 6 (six) hours as needed. 30 tablet 2   No current facility-administered medications for this visit.    PAST MEDICAL HISTORY: Past Medical History  Diagnosis Date  . Compartment syndrome of lower extremity 2004  . GERD (gastroesophageal reflux disease)     with pregnancy  . Headache(784.0)   . Neck pain   . Facial numbness     PAST SURGICAL HISTORY: Past Surgical History  Procedure Laterality Date  . Cesarean section  2011,2007  . Arthroscopic repair acl  [id number],2012  . Tonsillectomy  2001  . Anterior compartment decompression  2003  . Cesarean section N/A 09/13/2012    Procedure: CESAREAN SECTION;  Surgeon: Serita KyleSheronette A Cousins, MD;  Location: [facility];  Service: Obstetrics;  Laterality: N/A;  Repeat C/S  EDD: 09/20/12  . Dilatation & currettage/hysteroscopy with resectocope N/A 11/18/2012    Procedure: DILATATION & CURETTAGE/HYSTEROSCOPY WITH RESECTOCOPE;  Surgeon: Serita KyleSheronette A Cousins, MD;  Location: [facility];  Service: Gynecology;  Laterality: N/A;    FAMILY HISTORY: Family History  Problem Relation Age of Onset  . Hypertension Mother   . Hypothyroidism Father     SOCIAL HISTORY:  History   Social History  . Marital Status: Married    Spouse Name: Shawn    Number of Children: 3  . Years of Education: College   Occupational History  . 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 Smoking status: Never Smoker   . Smokeless tobacco: Not on file  . Alcohol Use: 0.0 oz/week    0 Not specified per week     Comment:  Social use  . Drug Use: No  . Sexual Activity: Not on file   Other Topics Concern  . Not on file   Social History Narrative   Right handed.   Lives at home with husband and 3 children.   2 cups/day     PHYSICAL EXAM   Filed Vitals:   07/27/14 0945  BP: 137/97  Pulse: 102  Height:  (1.651 m)  Weight: 162 lb (73.483 kg)    Not recorded      Body mass index is 26.96  kg/(m^2).   Generalized: In no acute distress  Neck: Supple, no carotid bruits   Cardiac: Regular rate rhythm  Pulmonary: Clear to auscultation bilaterally  Musculoskeletal: No deformity  Neurological examination  Mentation: Alert oriented to time, place, history taking, and causual conversation  Cranial nerve II-XII: Pupils were equal round reactive to light. Extraocular movements were full.  Visual field were full on confrontational test. Bilateral fundi were sharp.  Facial  strength were normal. Decreased right corneal sensation, Hearing was intact to finger rubbing bilaterally. Uvula tongue midline.  Head turning and shoulder shrug and were normal and symmetric.Tongue protrusion into cheek strength was normal.  Motor: Mild left shoulder abduction, external rotation weakness,.  Sensory: Decreased light touch, pinprick, at right face, involving right V1, V2, V3, also decreased light touch, pinprick, at right arm, right leg,  Coordination: Normal finger to nose, heel-to-shin bilaterally there was no truncal ataxia  Gait: Rising up from seated position without assistance, normal stance, without trunk ataxia, moderate stride, good arm swing, smooth turning, able to perform tiptoe, and heel walking without difficulty.   Romberg signs: Negative  Deep tendon reflexes: Brachioradialis 2/2, biceps 2/2, triceps 2/2, patellar 2/2, Achilles 2/2, plantar responses were flexor bilaterally.   DIAGNOSTIC DATA (LABS, IMAGING, TESTING) - I reviewed patient records, labs, notes, testing and imaging myself where available.  Lab Results  Component Value Date   WBC 6.6 11/18/2012   HGB 12.7 11/18/2012   HCT 38.6 11/18/2012   MCV 99.7 11/18/2012   PLT 359 11/18/2012   No results found for: NA, K, CL, CO2, GLUCOSE, BUN, CREATININE, CALCIUM, PROT, ALBUMIN, AST, ALT, ALKPHOS, BILITOT, GFRNONAA, GFRAA No results found for: CHOL, HDL, LDLCALC, LDLDIRECT, TRIG, CHOLHDL No results found for:  HGBA1C No results found for: VITAMINB12 No results found for: TSH    ASSESSMENT AND PLAN  Mary King is a 34 y.o. female  presenting with few years history of right sided paresthesia, history of left cervical radiculopathy  MRI brain to rule out left hemisphere pathology Return to clinic in 3 weeks  Orders Placed This Encounter  Procedures  . MR Brain Wo Contrast    Return in about 3 weeks (around 08/17/2014). Levert Feinstein, M.D. Ph.D.  [facility] [address], [address] [place], [place] [address] [phone]

## 2014-08-17 ENCOUNTER — Telehealth: Payer: Self-pay | Admitting: *Deleted

## 2014-08-17 ENCOUNTER — Ambulatory Visit: Payer: BLUE CROSS/BLUE SHIELD | Admitting: Neurology

## 2014-08-17 NOTE — Telephone Encounter (Signed)
No show

## 2014-08-20 ENCOUNTER — Encounter: Payer: Self-pay | Admitting: Neurology

## 2014-08-21 ENCOUNTER — Telehealth: Payer: Self-pay | Admitting: Neurology

## 2014-08-21 NOTE — Telephone Encounter (Signed)
Ok to give her xanax package before MRI

## 2014-08-21 NOTE — Telephone Encounter (Signed)
Patient requesting Xanax Packet to take prior to MRI.  Okay to dispense?  Please advise.  Thank you.

## 2014-08-21 NOTE — Telephone Encounter (Signed)
Patient requesting Prudy FeelerXANAX for MRI scheduled 08/24/14.  Please call when ready for pick up.

## 2014-08-22 NOTE — Telephone Encounter (Signed)
Spoke to [employer] Micro[employer]Candice - she will come by the office to pick up meds - placed up front for her.

## 2014-08-22 NOTE — Telephone Encounter (Signed)
Pt is returning your call regarding Xanax before MRI.  Please call back.

## 2014-08-22 NOTE — Telephone Encounter (Signed)
Left message to return call 

## 2014-08-24 ENCOUNTER — Ambulatory Visit
Admission: RE | Admit: 2014-08-24 | Discharge: 2014-08-24 | Disposition: A | Discharge status: Home / Self Care | Payer: BLUE CROSS/BLUE SHIELD | Source: Ambulatory Visit | Attending: Neurology | Admitting: Neurology

## 2014-08-24 DIAGNOSIS — M542 Cervicalgia: Secondary | ICD-10-CM

## 2014-08-24 DIAGNOSIS — R202 Paresthesia of skin: Secondary | ICD-10-CM

## 2014-08-27 ENCOUNTER — Telehealth: Payer: Self-pay | Admitting: Neurology

## 2014-08-27 NOTE — Telephone Encounter (Signed)
Patient aware of results and will keep follow up appt on 08/30/14.

## 2014-08-27 NOTE — Telephone Encounter (Signed)
Left message for a return call

## 2014-08-27 NOTE — Telephone Encounter (Signed)
Patient returning call. Please call on cell.

## 2014-08-27 NOTE — Telephone Encounter (Signed)
Michelle: Please call patient,  MRI of the brain showed nonspecific findings I will explain in detail at her follow-up visit in March third,  No acute intracranial abnormality or parenchymal mass. 2. Several punctate foci of cerebral white matter T2 signal abnormality, nonspecific. 3. 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

## 2014-08-30 ENCOUNTER — Encounter: Payer: Self-pay | Admitting: Neurology

## 2014-08-30 ENCOUNTER — Ambulatory Visit (INDEPENDENT_AMBULATORY_CARE_PROVIDER_SITE_OTHER): Payer: BLUE CROSS/BLUE SHIELD | Admitting: Neurology

## 2014-08-30 VITALS — BP 127/68 | HR 68 | Ht 65.0 in | Wt 162.0 lb

## 2014-08-30 DIAGNOSIS — G43009 Migraine without aura, not intractable, without status migrainosus: Secondary | ICD-10-CM | POA: Diagnosis not present

## 2014-08-30 DIAGNOSIS — M542 Cervicalgia: Secondary | ICD-10-CM

## 2014-08-30 DIAGNOSIS — R202 Paresthesia of skin: Secondary | ICD-10-CM | POA: Diagnosis not present

## 2014-08-30 MED ORDER — NORTRIPTYLINE HCL 10 MG PO CAPS: ORAL_CAPSULE | ORAL | Status: DC

## 2014-08-30 MED ORDER — RIZATRIPTAN BENZOATE 10 MG PO TBDP: 10.0000 mg | ORAL_TABLET | ORAL | Status: DC | PRN

## 2014-08-30 NOTE — Progress Notes (Signed)
PATIENT: Mary King DOB: December 10, 1980  HISTORICAL  Mary King is a 34 yo RH female, referred by Dr. Ethelene Halamos, and her primary care physician Dr. Kevan NyGates for evaluation of right-sided numbness  She had a history of bilateral lower extremity compartment syndrome while playing soccer in her teens, had compartment decompression surgery, since 2013, was seen by Dr. Ethelene Halamos for left-sided neck pain, radiating pain to left upper extremity, per patient, [facility] showed multilevel cervical degenerative disc disease, possible left C6 root compression, in 2015, she received multiple cervical epidural injection, for her left neck pain, upper lowe back pain, which was helpful  In 2013, during her most recent pregnancy, she noticed right facial numbness, lasting for few weeks, gradually disappeared,  Since October 2015, she noticed recurrent right facial numbness, which has been persistent, she denies right body involvement, no weakness, she has mild left arm weakness, attributed to her left cervical radiculopathy, she denies gait difficulty, denies visual loss, no bowel or bladder incontinence.  She also complains of frequent headaches, spreading forward from her neck pain, settled as the right retro-orbital area moderate to severe pressure headaches  UPDATE August 30 2014:  We have reviewed MRI brain Feb 26th 2016: No acute intracranial abnormality or parenchymal mass. Several punctate foci of cerebral white matter T2 signal abnormality, nonspecific. 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  She has frequent headaches, daily, right retrorbital, 7/10, lasting for half day, to few days, light  Smell sensitive,  Movement made it worse, sleep in dark quiet room was helpful   her headache often started formoterol neck, spreading forward  REVIEW OF SYSTEMS: Full 14 system review of systems performed and notable only for headaches, numbness, weakness,  anxiety ALLERGIES: Allergies  Allergen Reactions  . Codeine Nausea Only    HOME MEDICATIONS: Current Outpatient Prescriptions  Medication Sig Dispense Refill  . acetaminophen (TYLENOL) 500 MG tablet Take 1,000 mg by mouth every 6 (six) hours as needed for pain (back Pain).    . cetirizine (ZYRTEC) 10 MG tablet Take 10 mg by mouth daily.    Marland Kitchen. HYDROcodone-acetaminophen (NORCO/VICODIN) 5-325 MG per tablet Take 1 tablet by mouth 3 (three) times daily as needed.  0  . ibuprofen (ADVIL,MOTRIN) 600 MG tablet Take 1 tablet (600 mg total) by mouth every 6 (six) hours as needed. 30 tablet 2   No current facility-administered medications for this visit.    PAST MEDICAL HISTORY: Past Medical History  Diagnosis Date  . Compartment syndrome of lower extremity 2004  . GERD (gastroesophageal reflux disease)     with pregnancy  . Headache(784.0)   . Neck pain   . Facial numbness     PAST SURGICAL HISTORY: Past Surgical History  Procedure Laterality Date  . Cesarean section  2011,2007  . Arthroscopic repair acl  [id number],2012  . Tonsillectomy  2001  . Anterior compartment decompression  2003  . Cesarean section N/A 09/13/2012    Procedure: CESAREAN SECTION;  Surgeon: Serita KyleSheronette A Cousins, MD;  Location: [facility];  Service: Obstetrics;  Laterality: N/A;  Repeat C/S  EDD: 09/20/12  . Dilatation & currettage/hysteroscopy with resectocope N/A 11/18/2012    Procedure: DILATATION & CURETTAGE/HYSTEROSCOPY WITH RESECTOCOPE;  Surgeon: Serita KyleSheronette A Cousins, MD;  Location: [facility];  Service: Gynecology;  Laterality: N/A;    FAMILY HISTORY: Family History  Problem Relation Age of Onset  . Hypertension Mother   . Hypothyroidism Father     SOCIAL HISTORY:  History   Social History  . Marital Status: Married    Spouse Name: Ines Bloomer  . Number of Children: 3  . Years of Education: College   Occupational History  . 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 Smoking status: Never Smoker    . Smokeless tobacco: Not on file  . Alcohol Use: 0.0 oz/week    0 Standard drinks or equivalent per week     Comment: Social use  . Drug Use: No  . Sexual Activity: Not on file   Other Topics Concern  . Not on file   Social History Narrative   Right handed.   Lives at home with husband and 3 children.   2 cups/day     PHYSICAL EXAM   Filed Vitals:   08/30/14 1122  BP: 127/68  Pulse: 68  Height:  (1.651 m)  Weight: 162 lb (73.483 kg)    Not recorded      Body mass index is 26.96 kg/(m^2).  PHYSICAL EXAMNIATION:  Gen: NAD, conversant, well nourised, obese, well groomed                     Cardiovascular: Regular rate rhythm, no peripheral edema, warm, nontender. Eyes: Conjunctivae clear without exudates or hemorrhage Neck: Supple, no carotid bruise. Pulmonary: Clear to auscultation bilaterally   NEUROLOGICAL EXAM:  MENTAL STATUS: Speech:    Speech is normal; fluent and spontaneous with normal comprehension.  Cognition:    The patient is oriented to person, place, and time;     recent and remote memory intact;     language fluent;     normal attention, concentration,     fund of knowledge.  CRANIAL NERVES: CN II: Visual fields are full to confrontation. Fundoscopic exam is normal with sharp discs and no vascular changes. Venous pulsations are present bilaterally. Pupils are 4 mm and briskly reactive to light. Visual acuity is 20/20 bilaterally. CN III, IV, VI: extraocular movement are normal. No ptosis. CN V: Facial sensation is intact to pinprick in all 3 divisions bilaterally. Corneal responses are intact.  CN VII: Face is symmetric with normal eye closure and smile. CN VIII: Hearing is normal to rubbing fingers CN IX, X: Palate elevates symmetrically. Phonation is normal. CN XI: Head turning and shoulder shrug are intact CN XII: Tongue is midline with normal movements and no atrophy.  MOTOR: There is no pronator drift of out-stretched arms. Muscle  bulk and tone are normal. Muscle strength is normal.   Shoulder abduction Shoulder external rotation Elbow flexion Elbow extension Wrist flexion Wrist extension Finger abduction Hip flexion Knee flexion Knee extension Ankle dorsi flexion Ankle plantar flexion  R L REFLEXES: Reflexes are 2+ and symmetric at the biceps, triceps, knees, and ankles. Plantar responses are flexor.  SENSORY: Light touch, pinprick, position sense, and vibration sense are intact in fingers and toes.  COORDINATION: Rapid alternating movements and fine finger movements are intact. There is no dysmetria on finger-to-nose and heel-knee-shin. There are no abnormal or extraneous movements.   GAIT/STANCE: Posture is normal. Gait is steady with normal steps, base, arm swing, and turning. Heel and toe walking are normal. Tandem gait is normal.  Romberg is absent.    DIAGNOSTIC DATA (LABS, IMAGING, TESTING) - I reviewed patient records,  labs, notes, testing and imaging myself where available.  Lab Results  Component Value Date   WBC 6.6 11/18/2012   HGB 12.7 11/18/2012   HCT 38.6 11/18/2012   MCV 99.7 11/18/2012   PLT 359 11/18/2012     ASSESSMENT AND PLAN  Mary King is a 34 y.o. female  presenting with few years history of right sided paresthesia,  No significant abnormality on MRI of the brain, she also complains of frequent headaches, with migraine features   1, start preventive medications nortriptyline, titrating to 2 tablets every night  2. Maxalt as needed  3. Return to clinic in 3 months   Return in about 3 months (around 11/30/2014).   Levert Feinstein, M.D. Ph.D.  [facility] [address] [place], [place] [address] [phone]

## 2014-11-06 ENCOUNTER — Telehealth: Payer: Self-pay

## 2014-11-06 NOTE — Telephone Encounter (Signed)
Called patient and left message asking to call back to the office and get her apt. rescheduled with Dr. Terrace ArabiaYan. On 11/30/2014. Please when patient call's back schedule her with Dr.Yan in a 15 minute slot. Thanks Annabelle Harmanana .

## 2014-11-30 ENCOUNTER — Ambulatory Visit: Payer: BLUE CROSS/BLUE SHIELD | Admitting: Neurology

## 2014-12-19 ENCOUNTER — Ambulatory Visit (INDEPENDENT_AMBULATORY_CARE_PROVIDER_SITE_OTHER): Payer: BLUE CROSS/BLUE SHIELD | Admitting: Neurology

## 2014-12-19 ENCOUNTER — Encounter: Payer: Self-pay | Admitting: Neurology

## 2014-12-19 VITALS — BP 124/81 | HR 95 | Ht 65.0 in | Wt 158.0 lb

## 2014-12-19 DIAGNOSIS — G43009 Migraine without aura, not intractable, without status migrainosus: Secondary | ICD-10-CM | POA: Diagnosis not present

## 2014-12-19 DIAGNOSIS — M542 Cervicalgia: Secondary | ICD-10-CM

## 2014-12-19 MED ORDER — TOPIRAMATE 25 MG PO TABS: ORAL_TABLET | ORAL | Status: DC

## 2014-12-19 NOTE — Progress Notes (Signed)
Chief Complaint  Patient presents with  . Migraine    Feels nortriptyline has improved her headaches.  She is still getting approximately two migraines monthly but they respond well to Maxalt.      PATIENT: Mary King DOB: 09/15/1980  HISTORICAL  Mary King is a 34 yo RH female, referred by Dr. Ethelene Hal, and her primary care physician Dr. Kevan Ny for evaluation of right-sided numbness  She had a history of bilateral lower extremity compartment syndrome while playing soccer in her teens, had compartment decompression surgery, since 2013, was seen by Dr. Ethelene Hal for left-sided neck pain, radiating pain to left upper extremity, per patient, MRI of cervical spine at [facility] orthopedic showed multilevel cervical degenerative disc disease, possible left C6 root compression, in 2015, she received multiple cervical epidural injection, for her left neck pain, upper lowe back pain, which was helpful  In 2013, during her most recent pregnancy, she noticed right facial numbness, lasting for few weeks, gradually disappeared,  Since October 2015, she noticed recurrent right facial numbness, which has been persistent, she denies right body involvement, no weakness, she has mild left arm weakness, attributed to her left cervical radiculopathy, she denies gait difficulty, denies visual loss, no bowel or bladder incontinence.  She also complains of frequent headaches, spreading forward from her neck pain, settled as the right retro-orbital area moderate to severe pressure headaches  UPDATE August 30 2014:  We have reviewed MRI brain Feb 26th 2016: No acute intracranial abnormality or parenchymal mass. Several punctate foci of cerebral white matter T2 signal abnormality, nonspecific. 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  She has frequent headaches, daily, right retrorbital, 7/10, lasting for half day, to few days, light  Smell sensitive,  Movement made it worse, sleep in dark quiet room was  helpful  Her headache often started formoterol neck, spreading forward  UPDATE June 22nd 2016: Nortriptyline has helped her migraine, she is now taking 20 mg daily, she can sleep better, no significant side effect,. Maxalt 10 mg prn was helpful, sometimes need repeat dose. She still has 2-3 migraines each month, often times noticed right eye jumpiness a week preceding headaches  She continues to complains of left-sided neck pain, radiating pain to left upper extremity, intermittent left hand paresthesia  REVIEW OF SYSTEMS: Full 14 system review of systems performed and notable only for memory loss, headaches, neck pain, neck stiffness, anxiety ALLERGIES: Allergies  Allergen Reactions  . Codeine Nausea Only    HOME MEDICATIONS: Current Outpatient Prescriptions  Medication Sig Dispense Refill  . acetaminophen (TYLENOL) 500 MG tablet Take 1,000 mg by mouth every 6 (six) hours as needed for pain (back Pain).    . cetirizine (ZYRTEC) 10 MG tablet Take 10 mg by mouth daily.    Marland Kitchen ibuprofen (ADVIL,MOTRIN) 600 MG tablet Take 1 tablet (600 mg total) by mouth every 6 (six) hours as needed. 30 tablet 2  . nortriptyline (PAMELOR) 10 MG capsule One po qhs xone week, then 2 tabs po qhs 60 capsule 11  . rizatriptan (MAXALT-MLT) 10 MG disintegrating tablet Take 1 tablet (10 mg total) by mouth as needed. May repeat in 2 hours if needed 15 tablet 6   No current facility-administered medications for this visit.    PAST MEDICAL HISTORY: Past Medical History  Diagnosis Date  . Compartment syndrome of lower extremity 2004  . GERD (gastroesophageal reflux disease)     with pregnancy  . Headache(784.0)   . Neck pain   . Facial  numbness     PAST SURGICAL HISTORY: Past Surgical History  Procedure Laterality Date  . Cesarean section  2011,2007  . Arthroscopic repair acl  [id number]  . Tonsillectomy  2001  . Anterior compartment decompression  2003  . Cesarean section N/A 09/13/2012    Procedure:  CESAREAN SECTION;  Surgeon: Serita Kyle, MD;  Location: [facility];  Service: Obstetrics;  Laterality: N/A;  Repeat C/S  EDD: 09/20/12  . Dilatation & currettage/hysteroscopy with resectocope N/A 11/18/2012    Procedure: DILATATION & CURETTAGE/HYSTEROSCOPY WITH RESECTOCOPE;  Surgeon: Serita Kyle, MD;  Location: [facility];  Service: Gynecology;  Laterality: N/A;    FAMILY HISTORY: Family History  Problem Relation Age of Onset  . Hypertension Mother   . Hypothyroidism Father     SOCIAL HISTORY:  History   Social History  . Marital Status: Married    Spouse Name: Ines Bloomer  . Number of Children: 3  . Years of Education: College   Occupational History  . 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 Smoking status: Never Smoker   . Smokeless tobacco: Not on file  . Alcohol Use: 0.0 oz/week    0 Standard drinks or equivalent per week     Comment: Social use  . Drug Use: No  . Sexual Activity: Not on file   Other Topics Concern  . Not on file   Social History Narrative   Right handed.   Lives at home with husband and 3 children.   2 cups/day     PHYSICAL EXAM   Filed Vitals:   12/19/14 1149  BP: 124/81  Pulse: 95  Height:  (1.651 m)  Weight: 158 lb (71.668 kg)    Not recorded      Body mass index is 26.29 kg/(m^2).  PHYSICAL EXAMNIATION:  Gen: NAD, conversant, well nourised, obese, well groomed                     Cardiovascular: Regular rate rhythm, no peripheral edema, warm, nontender. Eyes: Conjunctivae clear without exudates or hemorrhage Neck: Supple, no carotid bruise. Pulmonary: Clear to auscultation bilaterally   NEUROLOGICAL EXAM:  MENTAL STATUS: Speech:    Speech is normal; fluent and spontaneous with normal comprehension.  Cognition:    The patient is oriented to person, place, and time;     recent and remote memory intact;     language fluent;     normal attention, concentration,     fund of knowledge.  CRANIAL  NERVES: CN II: Visual fields are full to confrontation. Fundoscopic exam is normal with sharp discs and no vascular changes. Venous pulsations are present bilaterally. Pupils are 4 mm and briskly reactive to light. CN III, IV, VI: extraocular movement are normal. No ptosis. CN V: Facial sensation is intact to pinprick in all 3 divisions bilaterally. Corneal responses are intact.  CN VII: Face is symmetric with normal eye closure and smile. CN VIII: Hearing is normal to rubbing fingers CN IX, X: Palate elevates symmetrically. Phonation is normal. CN XI: Head turning and shoulder shrug are intact CN XII: Tongue is midline with normal movements and no atrophy.  MOTOR: There is no pronator drift of out-stretched arms. Muscle bulk and tone are normal. Muscle strength is normal.   Shoulder abduction Shoulder external rotation Elbow flexion Elbow extension Wrist flexion Wrist extension Finger abduction Hip flexion Knee flexion Knee extension Ankle dorsi flexion Ankle plantar flexion  R  L REFLEXES: Reflexes are 2+ and symmetric at the biceps, triceps, knees, and ankles. Plantar responses are flexor.  SENSORY: Light touch, pinprick, position sense, and vibration sense are intact in fingers and toes.  COORDINATION: Rapid alternating movements and fine finger movements are intact. There is no dysmetria on finger-to-nose and heel-knee-shin. There are no abnormal or extraneous movements.   GAIT/STANCE: Posture is normal. Gait is steady with normal steps, base, arm swing, and turning. Heel and toe walking are normal. Tandem gait is normal.  Romberg is absent.  DIAGNOSTIC DATA (LABS, IMAGING, TESTING) - I reviewed patient records, labs, notes, testing and imaging myself where available.  Lab Results  Component Value Date   WBC 6.6 11/18/2012   HGB 12.7 11/18/2012   HCT 38.6 11/18/2012   MCV 99.7 11/18/2012   PLT 359 11/18/2012      ASSESSMENT AND PLAN  Mary King is a 34 y.o. female  presenting with few years history of right sided paresthesia,  No significant abnormality on MRI of the brain, she also complains of frequent headaches, with migraine features   1, migraine,responded to nortriptyline, but continue to have 2-3 migraines each months, I have added on Topamax, 25 mg titrating to 2 tablets twice a day as preventive medications, keep Maxalt as needed 2, left cervical radiculopathy, I have suggested stretching exercise, hot compression, when necessary NSAIDs, she is also receiving epidural injection/nerve block by pain management Dr. Ethelene Hal occasionally  3. Return to clinic with nurse practitioner in 3 months   Levert Feinstein, M.D. Ph.D.  [facility] [address], [address] [place], [place] [address] [phone]

## 2015-03-21 ENCOUNTER — Ambulatory Visit: Payer: BLUE CROSS/BLUE SHIELD | Admitting: Nurse Practitioner

## 2015-03-26 ENCOUNTER — Ambulatory Visit (INDEPENDENT_AMBULATORY_CARE_PROVIDER_SITE_OTHER): Payer: BLUE CROSS/BLUE SHIELD | Admitting: Nurse Practitioner

## 2015-03-26 ENCOUNTER — Encounter: Payer: Self-pay | Admitting: Nurse Practitioner

## 2015-03-26 VITALS — BP 129/76 | HR 108 | Ht 66.0 in | Wt 157.4 lb

## 2015-03-26 DIAGNOSIS — R202 Paresthesia of skin: Secondary | ICD-10-CM | POA: Diagnosis not present

## 2015-03-26 DIAGNOSIS — G43009 Migraine without aura, not intractable, without status migrainosus: Secondary | ICD-10-CM | POA: Diagnosis not present

## 2015-03-26 DIAGNOSIS — M542 Cervicalgia: Secondary | ICD-10-CM

## 2015-03-26 MED ORDER — RIZATRIPTAN BENZOATE 10 MG PO TBDP: 10.0000 mg | ORAL_TABLET | ORAL | Status: DC | PRN

## 2015-03-26 NOTE — Progress Notes (Signed)
[facility]  PATIENT: Mary King DOB: 28-Sep-1980   REASON FOR VISIT: Follow-up for history of migraines and paresthesias HISTORY FROM: Patient    HISTORY OF PRESENT ILLNESS:Mary King is a 34 yo RH female, referred by Dr. Ethelene Hal, and her primary care physician Dr. Kevan Ny for evaluation of right-sided numbness She had a history of bilateral lower extremity compartment syndrome while playing soccer in her teens, had compartment decompression surgery, since 2013, was seen by Dr. Ethelene Hal for left-sided neck pain, radiating pain to left upper extremity, per patient, MRI of cervical spine at [facility] orthopedic showed multilevel cervical degenerative disc disease, possible left C6 root compression, in 2015, she received multiple cervical epidural injection, for her left neck pain, upper lowe back pain, which was helpful In 2013, during her most recent pregnancy, she noticed right facial numbness, lasting for few weeks, gradually disappeared,  Since October 2015, she noticed recurrent right facial numbness, which has been persistent, she denies right body involvement, no weakness, she has mild left arm weakness, attributed to her left cervical radiculopathy, she denies gait difficulty, denies visual loss, no bowel or bladder incontinence. She also complains of frequent headaches, spreading forward from her neck pain, settled as the right retro-orbital area moderate to severe pressure headaches  UPDATE August 30 2014:We have reviewed MRI brain Feb 26th 2016: No acute intracranial abnormality or parenchymal mass. Several punctate foci of cerebral white matter T2 signal abnormality, nonspecific. 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 She has frequent headaches, daily, right retrorbital, 7/10, lasting for half day, to few days, light Smell sensitive, Movement made it worse, sleep in dark quiet room was helpful Her headache often started formoterol neck, spreading  forward UPDATE June 22nd 2016: Nortriptyline has helped her migraine, she is now taking 20 mg daily, she can sleep better, no significant side effect,. Maxalt 10 mg prn was helpful, sometimes need repeat dose. She still has 2-3 migraines each month, often times noticed right eye jumpiness a week preceding headaches  She continues to complains of left-sided neck pain, radiating pain to left upper extremity UPDATE 03/26/2015 [name], 34 year old female returns for follow-up. She was last seen by Dr. Terrace Arabia 12/19/2014 and in addition to nortriptyline was placed on Topamax at that time . Her headaches are much improved she is currently having one to 2 headaches per month. She continues to have left-sided neck pain and just had steroid injection 2 weeks ago by Dr. Ethelene Hal.Maxalt continues to work for her acutely. She returns for reevaluation  REVIEW OF SYSTEMS: Full 14 system review of systems performed and notable only for those listed, all others are neg:  Constitutional: neg  Cardiovascular: neg Ear/Nose/Throat: neg  Skin: neg Eyes: neg Respiratory: neg Gastroitestinal: neg  Hematology/Lymphatic: neg  Endocrine: neg Musculoskeletal:neg Allergy/Immunology: neg Neurological: neg Psychiatric: neg Sleep : neg   ALLERGIES: Allergies  Allergen Reactions  . Codeine Nausea Only    HOME MEDICATIONS: Outpatient Prescriptions Prior to Visit  Medication Sig Dispense Refill  . acetaminophen (TYLENOL) 500 MG tablet Take 1,000 mg by mouth every 6 (six) hours as needed for pain (back Pain).    . cetirizine (ZYRTEC) 10 MG tablet Take 10 mg by mouth daily.    Marland Kitchen ibuprofen (ADVIL,MOTRIN) 600 MG tablet Take 1 tablet (600 mg total) by mouth every 6 (six) hours as needed. 30 tablet 2  . nortriptyline (PAMELOR) 10 MG capsule One po qhs xone week, then 2 tabs po qhs 60 capsule 11  .  rizatriptan (MAXALT-MLT) 10 MG disintegrating tablet Take 1 tablet (10 mg total) by mouth as needed. May repeat in 2 hours  if needed 15 tablet 6  . topiramate (TOPAMAX) 25 MG tablet 1 tablet twice a day for 1 week, then 2 tablets twice a day 120 tablet 6   No facility-administered medications prior to visit.    PAST MEDICAL HISTORY: Past Medical History  Diagnosis Date  . Compartment syndrome of lower extremity 2004  . GERD (gastroesophageal reflux disease)     with pregnancy  . Headache(784.0)   . Neck pain   . Facial numbness     PAST SURGICAL HISTORY: Past Surgical History  Procedure Laterality Date  . Cesarean section  2011,2007  . Arthroscopic repair acl  [id number]  . Tonsillectomy  2001  . Anterior compartment decompression  2003  . Cesarean section N/A 09/13/2012    Procedure: CESAREAN SECTION;  Surgeon: Serita Kyle, MD;  Location: [facility];  Service: Obstetrics;  Laterality: N/A;  Repeat C/S  EDD: 09/20/12  . Dilatation & currettage/hysteroscopy with resectocope N/A 11/18/2012    Procedure: DILATATION & CURETTAGE/HYSTEROSCOPY WITH RESECTOCOPE;  Surgeon: Serita Kyle, MD;  Location: [facility];  Service: Gynecology;  Laterality: N/A;    FAMILY HISTORY: Family History  Problem Relation Age of Onset  . Hypertension Mother   . Hypothyroidism Father     SOCIAL HISTORY: Social History   Social History  . Marital Status: Married    Spouse Name: Ines Bloomer  . Number of Children: 3  . Years of Education: College   Occupational History  . 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 Smoking status: Never Smoker   . Smokeless tobacco: Not on file  . Alcohol Use: 0.0 oz/week    0 Standard drinks or equivalent per week     Comment: Social use  . Drug Use: No  . Sexual Activity: Not on file   Other Topics Concern  . Not on file   Social History Narrative   Right handed.   Lives at home with husband and 3 children.   2 cups/day     PHYSICAL EXAM  Filed Vitals:   03/26/15 0858  BP: 129/76  Pulse: 108  Height:  (1.676 m)  Weight: 157 lb 6.4 oz (71.396  kg)   Body mass index is 25.42 kg/(m^2).  Generalized: Well developed, in no acute distress  Head: normocephalic and atraumatic,. Oropharynx benign  Neck: Supple, no carotid bruits  Cardiac: Regular rate rhythm, no murmur  Musculoskeletal: No deformity   Neurological examination   Mentation: Alert oriented to time, place, history taking. Attention span and concentration appropriate. Recent and remote memory intact.  Follows all commands speech and language fluent.   Cranial nerve II-XII: Fundoscopic exam reveals sharp disc margins.Pupils were equal round reactive to light extraocular movements were full, visual field were full on confrontational test. Facial sensation and strength were normal. hearing was intact to finger rubbing bilaterally. Uvula tongue midline. head turning and shoulder shrug were normal and symmetric.Tongue protrusion into cheek strength was normal. Motor: normal bulk and tone, full strength in the BUE, BLE, fine finger movements normal, no pronator drift. No focal weakness Sensory: normal and symmetric to light touch, pinprick, and  Vibration, proprioception  Coordination: finger-nose-finger, heel-to-shin bilaterally, no dysmetria Reflexes: Brachioradialis 2/2, biceps 2/2, triceps 2/2, patellar 2/2, Achilles 2/2, plantar responses were flexor bilaterally. Gait and Station: Rising up from seated position without assistance,  normal stance,  moderate stride, good arm swing, smooth turning, able to perform tiptoe, and heel walking without difficulty. Tandem gait is steady  DIAGNOSTIC DATA (LABS, IMAGING, TESTING) -  ASSESSMENT AND PLAN  34 y.o. year old female  has a past medical history of  Headache(784.0); Neck pain; and Facial numbness. here to follow-up  Continue Topamax at current dose does not need refills Continue Nortriptyline at current dose does not need refills Continue Maxalt acutely will refill F/U 8 months If headaches worsen keep a diary Given a list  of food triggers that can cause migraines, eliminate one food at a time  Nilda Riggs, [facility], [facility], APRN  [facility] [address] [place], [place] [address] [phone]

## 2015-03-26 NOTE — Patient Instructions (Signed)
Continue Topamax Continue Nortriptyline Continue Maxalt acutely will refill F/U 8 months

## 2015-03-28 NOTE — Progress Notes (Signed)
I have reviewed and agreed above plan. 

## 2015-08-02 MED FILL — NORTRIPTYLINE HCL 10 MG CAP: 10 | 30 days supply | Qty: 60 | Fill #10

## 2015-09-05 MED FILL — TOPIRAMATE 25 MG TABLET: 25 | 30 days supply | Qty: 120 | Fill #4

## 2015-09-10 ENCOUNTER — Other Ambulatory Visit: Payer: Self-pay | Admitting: *Deleted

## 2015-09-10 MED ORDER — NORTRIPTYLINE HCL 10 MG PO CAPS: ORAL_CAPSULE | ORAL | Status: DC

## 2015-09-10 MED FILL — NORTRIPTYLINE HCL 10 MG CAP: 10 | 30 days supply | Qty: 60 | Fill #0

## 2015-10-21 MED FILL — NORTRIPTYLINE HCL 10 MG CAP: 10 | 30 days supply | Qty: 60 | Fill #1

## 2015-10-24 MED FILL — TOPIRAMATE 25 MG TABLET: 25 | 30 days supply | Qty: 120 | Fill #5

## 2015-11-15 MED FILL — RIZATRIPTAN 10 MG ODT: 10 | 30 days supply | Qty: 9 | Fill #2

## 2015-11-26 ENCOUNTER — Ambulatory Visit (INDEPENDENT_AMBULATORY_CARE_PROVIDER_SITE_OTHER): Payer: BLUE CROSS/BLUE SHIELD | Admitting: Nurse Practitioner

## 2015-11-26 ENCOUNTER — Encounter: Payer: Self-pay | Admitting: Nurse Practitioner

## 2015-11-26 VITALS — BP 123/82 | HR 92 | Ht 66.0 in | Wt 169.2 lb

## 2015-11-26 DIAGNOSIS — G43009 Migraine without aura, not intractable, without status migrainosus: Secondary | ICD-10-CM | POA: Diagnosis not present

## 2015-11-26 DIAGNOSIS — R202 Paresthesia of skin: Secondary | ICD-10-CM | POA: Diagnosis not present

## 2015-11-26 DIAGNOSIS — M542 Cervicalgia: Secondary | ICD-10-CM

## 2015-11-26 MED ORDER — TOPIRAMATE 25 MG PO TABS: ORAL_TABLET | ORAL | Status: DC

## 2015-11-26 MED ORDER — RIZATRIPTAN BENZOATE 10 MG PO TBDP: 10.0000 mg | ORAL_TABLET | ORAL | Status: DC | PRN

## 2015-11-26 MED FILL — TOPIRAMATE 25 MG TABLET: 25 | 90 days supply | Qty: 180 | Fill #0

## 2015-11-26 NOTE — Patient Instructions (Addendum)
Continue Topamax at current dose   Continue Nortriptyline at current dose  Continue Maxalt acutely will refill F/U  1 year If headaches worsen keep a diary

## 2015-11-26 NOTE — Progress Notes (Signed)
[facility]  PATIENT: Mary King DOB: 03-03-1981   REASON FOR VISIT: Follow-up for migraine without aura, neck pain on the left and paresthesias HISTORY FROM: Patient    HISTORY OF PRESENT ILLNESS: HISTORY Mary King is a 35 yo RH female, referred by Dr. Ethelene Halamos, and her primary care physician Dr. Kevan NyGates for evaluation of right-sided numbness She had a history of bilateral lower extremity compartment syndrome while playing soccer in her teens, had compartment decompression surgery, since 2013, was seen by Dr. Ethelene Halamos for left-sided neck pain, radiating pain to left upper extremity, per patient, [facility] showed multilevel cervical degenerative disc disease, possible left C6 root compression, in 2015, she received multiple cervical epidural injection, for her left neck pain, upper lowe back pain, which was helpful In 2013, during her most recent pregnancy, she noticed right facial numbness, lasting for few weeks, gradually disappeared,  Since October 2015, she noticed recurrent right facial numbness, which has been persistent, she denies right body involvement, no weakness, she has mild left arm weakness, attributed to her left cervical radiculopathy, she denies gait difficulty, denies visual loss, no bowel or bladder incontinence. She also complains of frequent headaches, spreading forward from her neck pain, settled as the right retro-orbital area moderate to severe pressure headaches  UPDATE August 30 2014:We have reviewed MRI brain Feb 26th 2016: No acute intracranial abnormality or parenchymal mass. Several punctate foci of cerebral white matter T2 signal abnormality, nonspecific. 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 She has frequent headaches, daily, right retrorbital, 7/10, lasting for half day, to few days, light Smell sensitive, Movement made it worse, sleep in dark quiet room was helpful Her headache often  started formoterol neck, spreading forward UPDATE 03/26/2015 CMMs. [name], 35 year old female returns for follow-up. She was last seen by Dr. Terrace ArabiaYan 12/19/2014 and in addition to nortriptyline was placed on Topamax at that time . Her headaches are much improved she is currently having one to 2 headaches per month. She continues to have left-sided neck pain and just had steroid injection 2 weeks ago by Dr. Ethelene Halamos.Maxalt continues to work for her acutely. She returns for reevaluation  UPDATE 5/30/17CM [name], 35 year old female returns for follow-up. She has a history of migraines which are currently in excellent control on Topamax and nortriptyline. She takes Maxalt acutely. She continues to get injections to her neck by Dr. Ethelene Halamos. She tells me that she may have cervical surgery later in the year. She returns for reevaluation. She needs refills.  REVIEW OF SYSTEMS: Full 14 system review of systems performed and notable only for those listed, all others are neg:  Constitutional: neg  Cardiovascular: neg Ear/Nose/Throat: neg  Skin: neg Eyes: neg Respiratory: neg Gastroitestinal: neg  Hematology/Lymphatic: neg  Endocrine: neg Musculoskeletal: Neck pain Allergy/Immunology: neg Neurological: History of headaches Psychiatric: neg Sleep : neg   ALLERGIES: Allergies  Allergen Reactions  . Codeine Nausea Only    HOME MEDICATIONS: Outpatient Prescriptions Prior to Visit  Medication Sig Dispense Refill  . acetaminophen (TYLENOL) 500 MG tablet Take 1,000 mg by mouth every 6 (six) hours as needed for pain (back Pain).    . cetirizine (ZYRTEC) 10 MG tablet Take 10 mg by mouth daily.    Marland Kitchen. HYDROcodone-acetaminophen (NORCO/VICODIN) 5-325 MG per tablet Take 1 tablet by mouth 2 (two) times daily as needed.  0  . ibuprofen (ADVIL,MOTRIN) 600 MG tablet Take 1 tablet (600 mg total) by mouth every 6 (six) hours  as needed. 30 tablet 2  . nortriptyline (PAMELOR) 10 MG capsule Take 2 tabs po qhs 60  capsule 11  . rizatriptan (MAXALT-MLT) 10 MG disintegrating tablet Take 1 tablet (10 mg total) by mouth as needed. May repeat in 2 hours if needed 15 tablet 6  . topiramate (TOPAMAX) 25 MG tablet 1 tablet twice a day for 1 week, then 2 tablets twice a day (Patient taking differently: Taking 2 tabs po qhs  (  total).) 120 tablet 6   No facility-administered medications prior to visit.    PAST MEDICAL HISTORY: Past Medical History  Diagnosis Date  . Compartment syndrome of lower extremity (HCC) 2004  . GERD (gastroesophageal reflux disease)     with pregnancy  . Headache(784.0)   . Neck pain   . Facial numbness     PAST SURGICAL HISTORY: Past Surgical History  Procedure Laterality Date  . Cesarean section  2011,2007  . Arthroscopic repair acl  [id number]  . Tonsillectomy  2001  . Anterior compartment decompression  2003  . Cesarean section N/A 09/13/2012    Procedure: CESAREAN SECTION;  Surgeon: Serita Kyle, MD;  Location: [facility];  Service: Obstetrics;  Laterality: N/A;  Repeat C/S  EDD: 09/20/12  . Dilatation & currettage/hysteroscopy with resectocope N/A 11/18/2012    Procedure: DILATATION & CURETTAGE/HYSTEROSCOPY WITH RESECTOCOPE;  Surgeon: Serita Kyle, MD;  Location: [facility];  Service: Gynecology;  Laterality: N/A;    FAMILY HISTORY: Family History  Problem Relation Age of Onset  . Hypertension Mother   . Hypothyroidism Father     SOCIAL HISTORY: Social History   Social History  . Marital Status: Married    Spouse Name: Ines Bloomer  . Number of Children: 3  . Years of Education: College   Occupational History  . 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 Smoking status: Never Smoker   . Smokeless tobacco: Not on file  . Alcohol Use: 0.0 oz/week    0 Standard drinks or equivalent per week     Comment: Social use  . Drug Use: No  . Sexual Activity: Not on file   Other Topics Concern  . Not on file   Social History Narrative    Right handed.   Lives at home with husband and 3 children.   2 cups/day     PHYSICAL EXAM  Filed Vitals:   11/26/15 0934  BP: 123/82  Pulse: 92  Height:  (1.676 m)  Weight: 169 lb 3.2 oz (76.749 kg)   Body mass index is 27.32 kg/(m^2). Generalized: Well developed, in no acute distress  Head: normocephalic and atraumatic,. Oropharynx benign  Neck: Supple, no carotid bruits  Cardiac: Regular rate rhythm, no murmur  Musculoskeletal: No deformity   Neurological examination   Mentation: Alert oriented to time, place, history taking. Attention span and concentration appropriate. Recent and remote memory intact. Follows all commands speech and language fluent.   Cranial nerve II-XII: Pupils were equal round reactive to light extraocular movements were full, visual field were full on confrontational test. Facial sensation and strength were normal. hearing was intact to finger rubbing bilaterally. Uvula tongue midline. head turning and shoulder shrug were normal and symmetric.Tongue protrusion into cheek strength was normal. Motor: normal bulk and tone, full strength in the BUE, BLE, fine finger movements normal, no pronator drift. No focal weakness Sensory: normal and symmetric to light touch, in the upper and lower extremities Coordination: finger-nose-finger, heel-to-shin bilaterally, no  dysmetria Reflexes: Brachioradialis 2/2, biceps 2/2, triceps 2/2, patellar 2/2, Achilles 2/2, plantar responses were flexor bilaterally. Gait and Station: Rising up from seated position without assistance, normal stance, moderate stride, good arm swing, smooth turning, able to perform tiptoe, and heel walking without difficulty. Tandem gait is steady DIAGNOSTIC DATA (LABS, IMAGING, TESTING) -  ASSESSMENT AND PLAN 35 y.o. year old female has a past medical history of Headache(784.0); Neck pain; and Facial numbness. here to follow-up  Continue Topamax at current dose will refill Continue  Nortriptyline at current dose does not need refills Continue Maxalt acutely will refill F/U  In 1 year If headaches worsen keep a diary and notify us Nilda Riggs, [facility], [facility], APRN  [facility] [address], [address] [place], [place] [address] [phone]

## 2015-11-26 NOTE — Progress Notes (Signed)
I have reviewed and agreed above plan. 

## 2015-11-27 MED FILL — NORTRIPTYLINE HCL 10 MG CAP: 10 | 30 days supply | Qty: 60 | Fill #2

## 2015-12-19 MED FILL — RIZATRIPTAN 10 MG ODT: 10 | 30 days supply | Qty: 9 | Fill #3

## 2015-12-24 DIAGNOSIS — M50923 Unspecified cervical disc disorder at C6-C7 level: Secondary | ICD-10-CM | POA: Diagnosis not present

## 2015-12-24 DIAGNOSIS — Z79891 Long term (current) use of opiate analgesic: Secondary | ICD-10-CM | POA: Diagnosis not present

## 2015-12-25 MED FILL — NORTRIPTYLINE HCL 10 MG CAP: 10 | 30 days supply | Qty: 60 | Fill #3

## 2016-01-15 DIAGNOSIS — M5092 Unspecified cervical disc disorder, mid-cervical region, unspecified level: Secondary | ICD-10-CM | POA: Diagnosis not present

## 2016-02-05 MED FILL — NORTRIPTYLINE HCL 10 MG CAP: 10 | 30 days supply | Qty: 60 | Fill #4

## 2016-03-10 MED FILL — NORTRIPTYLINE HCL 10 MG CAP: 10 | 30 days supply | Qty: 60 | Fill #5 | Status: TO

## 2016-03-10 MED FILL — TOPIRAMATE 25 MG TABLET: 25 | 90 days supply | Qty: 180 | Fill #1

## 2016-04-01 ENCOUNTER — Other Ambulatory Visit: Payer: Self-pay | Admitting: Internal Medicine

## 2016-04-01 DIAGNOSIS — R109 Unspecified abdominal pain: Secondary | ICD-10-CM | POA: Diagnosis not present

## 2016-04-01 DIAGNOSIS — M5090 Cervical disc disorder, unspecified, unspecified cervical region: Secondary | ICD-10-CM | POA: Diagnosis not present

## 2016-04-01 DIAGNOSIS — G43909 Migraine, unspecified, not intractable, without status migrainosus: Secondary | ICD-10-CM | POA: Diagnosis not present

## 2016-04-01 DIAGNOSIS — F439 Reaction to severe stress, unspecified: Secondary | ICD-10-CM | POA: Diagnosis not present

## 2016-04-02 ENCOUNTER — Ambulatory Visit
Admission: RE | Admit: 2016-04-02 | Discharge: 2016-04-02 | Disposition: A | Discharge status: Home / Self Care | Payer: BLUE CROSS/BLUE SHIELD | Source: Ambulatory Visit | Attending: Internal Medicine | Admitting: Internal Medicine

## 2016-04-02 DIAGNOSIS — R109 Unspecified abdominal pain: Secondary | ICD-10-CM

## 2016-04-02 DIAGNOSIS — K219 Gastro-esophageal reflux disease without esophagitis: Secondary | ICD-10-CM | POA: Diagnosis not present

## 2016-04-13 DIAGNOSIS — M5412 Radiculopathy, cervical region: Secondary | ICD-10-CM | POA: Diagnosis not present

## 2016-04-15 DIAGNOSIS — F439 Reaction to severe stress, unspecified: Secondary | ICD-10-CM | POA: Diagnosis not present

## 2016-04-15 DIAGNOSIS — G43909 Migraine, unspecified, not intractable, without status migrainosus: Secondary | ICD-10-CM | POA: Diagnosis not present

## 2016-04-15 DIAGNOSIS — R109 Unspecified abdominal pain: Secondary | ICD-10-CM | POA: Diagnosis not present

## 2016-04-15 DIAGNOSIS — M5090 Cervical disc disorder, unspecified, unspecified cervical region: Secondary | ICD-10-CM | POA: Diagnosis not present

## 2016-04-17 ENCOUNTER — Other Ambulatory Visit: Payer: Self-pay | Admitting: Neurological Surgery

## 2016-04-17 DIAGNOSIS — M5412 Radiculopathy, cervical region: Secondary | ICD-10-CM

## 2016-04-28 DIAGNOSIS — M542 Cervicalgia: Secondary | ICD-10-CM | POA: Diagnosis not present

## 2016-04-28 DIAGNOSIS — G894 Chronic pain syndrome: Secondary | ICD-10-CM | POA: Diagnosis not present

## 2016-04-28 DIAGNOSIS — Z79891 Long term (current) use of opiate analgesic: Secondary | ICD-10-CM | POA: Diagnosis not present

## 2016-04-30 ENCOUNTER — Ambulatory Visit
Admission: RE | Admit: 2016-04-30 | Discharge: 2016-04-30 | Disposition: A | Discharge status: Home / Self Care | Payer: BLUE CROSS/BLUE SHIELD | Source: Ambulatory Visit | Attending: Neurological Surgery | Admitting: Neurological Surgery

## 2016-04-30 DIAGNOSIS — M4802 Spinal stenosis, cervical region: Secondary | ICD-10-CM | POA: Diagnosis not present

## 2016-04-30 DIAGNOSIS — M5412 Radiculopathy, cervical region: Secondary | ICD-10-CM

## 2016-05-05 DIAGNOSIS — M5412 Radiculopathy, cervical region: Secondary | ICD-10-CM | POA: Diagnosis not present

## 2016-05-28 DIAGNOSIS — D473 Essential (hemorrhagic) thrombocythemia: Secondary | ICD-10-CM | POA: Diagnosis not present

## 2016-05-28 DIAGNOSIS — M5090 Cervical disc disorder, unspecified, unspecified cervical region: Secondary | ICD-10-CM | POA: Diagnosis not present

## 2016-06-03 DIAGNOSIS — M5412 Radiculopathy, cervical region: Secondary | ICD-10-CM | POA: Diagnosis not present

## 2016-06-03 DIAGNOSIS — Z01812 Encounter for preprocedural laboratory examination: Secondary | ICD-10-CM | POA: Diagnosis not present

## 2016-06-11 DIAGNOSIS — M5412 Radiculopathy, cervical region: Secondary | ICD-10-CM | POA: Diagnosis not present

## 2016-06-15 ENCOUNTER — Other Ambulatory Visit: Payer: Self-pay | Admitting: *Deleted

## 2016-06-15 MED ORDER — NORTRIPTYLINE HCL 10 MG PO CAPS: ORAL_CAPSULE | ORAL | 1 refills | Status: DC

## 2016-07-27 DIAGNOSIS — M5412 Radiculopathy, cervical region: Secondary | ICD-10-CM | POA: Diagnosis not present

## 2016-10-26 DIAGNOSIS — M5412 Radiculopathy, cervical region: Secondary | ICD-10-CM | POA: Diagnosis not present

## 2016-11-25 ENCOUNTER — Ambulatory Visit: Payer: BLUE CROSS/BLUE SHIELD | Admitting: Nurse Practitioner

## 2016-11-26 ENCOUNTER — Encounter: Payer: Self-pay | Admitting: Nurse Practitioner

## 2016-12-15 DIAGNOSIS — M542 Cervicalgia: Secondary | ICD-10-CM | POA: Diagnosis not present

## 2017-01-22 DIAGNOSIS — M533 Sacrococcygeal disorders, not elsewhere classified: Secondary | ICD-10-CM | POA: Diagnosis not present

## 2017-03-18 DIAGNOSIS — M5412 Radiculopathy, cervical region: Secondary | ICD-10-CM | POA: Diagnosis not present

## 2017-03-18 DIAGNOSIS — R030 Elevated blood-pressure reading, without diagnosis of hypertension: Secondary | ICD-10-CM | POA: Diagnosis not present

## 2017-03-18 DIAGNOSIS — M542 Cervicalgia: Secondary | ICD-10-CM | POA: Diagnosis not present

## 2017-03-18 DIAGNOSIS — Z6827 Body mass index (BMI) 27.0-27.9, adult: Secondary | ICD-10-CM | POA: Diagnosis not present

## 2017-06-14 DIAGNOSIS — Z6827 Body mass index (BMI) 27.0-27.9, adult: Secondary | ICD-10-CM | POA: Diagnosis not present

## 2017-06-14 DIAGNOSIS — M5412 Radiculopathy, cervical region: Secondary | ICD-10-CM | POA: Diagnosis not present

## 2017-06-14 DIAGNOSIS — R030 Elevated blood-pressure reading, without diagnosis of hypertension: Secondary | ICD-10-CM | POA: Diagnosis not present

## 2017-06-14 DIAGNOSIS — M542 Cervicalgia: Secondary | ICD-10-CM | POA: Diagnosis not present

## 2017-08-10 DIAGNOSIS — M542 Cervicalgia: Secondary | ICD-10-CM | POA: Diagnosis not present

## 2017-08-11 DIAGNOSIS — R030 Elevated blood-pressure reading, without diagnosis of hypertension: Secondary | ICD-10-CM | POA: Diagnosis not present

## 2017-08-11 DIAGNOSIS — Z6826 Body mass index (BMI) 26.0-26.9, adult: Secondary | ICD-10-CM | POA: Diagnosis not present

## 2017-08-11 DIAGNOSIS — M5412 Radiculopathy, cervical region: Secondary | ICD-10-CM | POA: Diagnosis not present

## 2017-08-11 DIAGNOSIS — M542 Cervicalgia: Secondary | ICD-10-CM | POA: Diagnosis not present

## 2017-11-12 DIAGNOSIS — M5412 Radiculopathy, cervical region: Secondary | ICD-10-CM | POA: Diagnosis not present

## 2017-11-12 DIAGNOSIS — M542 Cervicalgia: Secondary | ICD-10-CM | POA: Diagnosis not present

## 2018-03-23 DIAGNOSIS — Z6826 Body mass index (BMI) 26.0-26.9, adult: Secondary | ICD-10-CM | POA: Diagnosis not present

## 2018-03-23 DIAGNOSIS — M542 Cervicalgia: Secondary | ICD-10-CM | POA: Diagnosis not present

## 2018-03-23 DIAGNOSIS — R030 Elevated blood-pressure reading, without diagnosis of hypertension: Secondary | ICD-10-CM | POA: Diagnosis not present

## 2018-03-23 DIAGNOSIS — M5412 Radiculopathy, cervical region: Secondary | ICD-10-CM | POA: Diagnosis not present

## 2018-06-06 DIAGNOSIS — M5412 Radiculopathy, cervical region: Secondary | ICD-10-CM | POA: Diagnosis not present

## 2018-06-06 DIAGNOSIS — M62838 Other muscle spasm: Secondary | ICD-10-CM | POA: Diagnosis not present

## 2018-06-06 DIAGNOSIS — M542 Cervicalgia: Secondary | ICD-10-CM | POA: Diagnosis not present

## 2018-06-06 DIAGNOSIS — R030 Elevated blood-pressure reading, without diagnosis of hypertension: Secondary | ICD-10-CM | POA: Diagnosis not present

## 2018-07-11 DIAGNOSIS — G43909 Migraine, unspecified, not intractable, without status migrainosus: Secondary | ICD-10-CM | POA: Diagnosis not present

## 2018-07-11 DIAGNOSIS — M5090 Cervical disc disorder, unspecified, unspecified cervical region: Secondary | ICD-10-CM | POA: Diagnosis not present

## 2018-07-11 DIAGNOSIS — R000 Tachycardia, unspecified: Secondary | ICD-10-CM | POA: Diagnosis not present

## 2018-07-11 DIAGNOSIS — Z0000 Encounter for general adult medical examination without abnormal findings: Secondary | ICD-10-CM | POA: Diagnosis not present

## 2018-07-11 DIAGNOSIS — D473 Essential (hemorrhagic) thrombocythemia: Secondary | ICD-10-CM | POA: Diagnosis not present

## 2018-08-08 DIAGNOSIS — R000 Tachycardia, unspecified: Secondary | ICD-10-CM | POA: Diagnosis not present

## 2018-09-01 DIAGNOSIS — M62838 Other muscle spasm: Secondary | ICD-10-CM | POA: Diagnosis not present

## 2018-09-01 DIAGNOSIS — M542 Cervicalgia: Secondary | ICD-10-CM | POA: Diagnosis not present

## 2018-09-01 DIAGNOSIS — I10 Essential (primary) hypertension: Secondary | ICD-10-CM | POA: Diagnosis not present

## 2018-09-01 DIAGNOSIS — M5412 Radiculopathy, cervical region: Secondary | ICD-10-CM | POA: Diagnosis not present

## 2018-09-07 ENCOUNTER — Emergency Department (HOSPITAL_COMMUNITY): Payer: BLUE CROSS/BLUE SHIELD

## 2018-09-07 ENCOUNTER — Other Ambulatory Visit: Payer: Self-pay

## 2018-09-07 ENCOUNTER — Encounter (HOSPITAL_COMMUNITY): Payer: Self-pay | Admitting: Emergency Medicine

## 2018-09-07 ENCOUNTER — Emergency Department (HOSPITAL_COMMUNITY)
Admission: EM | Admit: 2018-09-07 | Discharge: 2018-09-07 | Disposition: A | Discharge status: Home / Self Care | Payer: BLUE CROSS/BLUE SHIELD | Attending: Emergency Medicine | Admitting: Emergency Medicine

## 2018-09-07 DIAGNOSIS — Y929 Unspecified place or not applicable: Secondary | ICD-10-CM | POA: Insufficient documentation

## 2018-09-07 DIAGNOSIS — Z23 Encounter for immunization: Secondary | ICD-10-CM | POA: Insufficient documentation

## 2018-09-07 DIAGNOSIS — S61250A Open bite of right index finger without damage to nail, initial encounter: Secondary | ICD-10-CM | POA: Insufficient documentation

## 2018-09-07 DIAGNOSIS — W540XXA Bitten by dog, initial encounter: Secondary | ICD-10-CM | POA: Insufficient documentation

## 2018-09-07 DIAGNOSIS — Y939 Activity, unspecified: Secondary | ICD-10-CM | POA: Insufficient documentation

## 2018-09-07 DIAGNOSIS — Z2914 Encounter for prophylactic rabies immune globin: Secondary | ICD-10-CM | POA: Insufficient documentation

## 2018-09-07 DIAGNOSIS — Z79899 Other long term (current) drug therapy: Secondary | ICD-10-CM | POA: Insufficient documentation

## 2018-09-07 DIAGNOSIS — Y999 Unspecified external cause status: Secondary | ICD-10-CM | POA: Insufficient documentation

## 2018-09-07 LAB — POC URINE PREG, ED: Preg Test, Ur: NEGATIVE

## 2018-09-07 MED ORDER — TETANUS-DIPHTH-ACELL PERTUSSIS 5-2.5-18.5 LF-MCG/0.5 IM SUSP
0.5000 mL | Freq: Once | INTRAMUSCULAR | Status: AC
  Administered 2018-09-07: 0.5 mL via INTRAMUSCULAR
  Filled 2018-09-07: qty 0.5

## 2018-09-07 MED ORDER — RABIES IMMUNE GLOBULIN 150 UNIT/ML IM INJ
20.0000 [IU]/kg | INJECTION | Freq: Once | INTRAMUSCULAR | Status: AC
Start: 2018-09-07 — End: 2018-09-07
  Administered 2018-09-07: 1425 [IU] via INTRAMUSCULAR
  Filled 2018-09-07: qty 9.5

## 2018-09-07 MED ORDER — AMOXICILLIN-POT CLAVULANATE 875-125 MG PO TABS: 1.0000 | ORAL_TABLET | Freq: Two times a day (BID) | ORAL | 0 refills | Status: AC

## 2018-09-07 MED ORDER — RABIES VACCINE, PCEC IM SUSR
1.0000 mL | Freq: Once | INTRAMUSCULAR | Status: AC
  Administered 2018-09-07: 1 mL via INTRAMUSCULAR
  Filled 2018-09-07: qty 1

## 2018-09-07 NOTE — Discharge Instructions (Signed)
Please see the information and instructions below regarding your visit.  Your diagnoses today include:  1. Dog bite, initial encounter   2. Encounter for prophylactic administration of rabies immune globulin     Tests performed today include: See side panel of your discharge paperwork for testing performed today. Vital signs are listed at the bottom of these instructions.   X-ray shows no foreign body in your finger.  Medications prescribed:    Take any prescribed medications only as prescribed, and any over the counter medications only as directed on the packaging.  Please take all of your antibiotics until finished.   You may develop abdominal discomfort or nausea from the antibiotic. If this occurs, you may take it with food. Some patients also get diarrhea with antibiotics. You may help offset this with probiotics which you can buy or get in yogurt. Do not eat or take the probiotics until 2 hours after your antibiotic. Some women develop vaginal yeast infections after antibiotics. If you develop unusual vaginal discharge after being on this medication, please see your primary care provider.   Some people develop allergies to antibiotics. Symptoms of antibiotic allergy can be mild and include a flat rash and itching. They can also be more serious and include:  ?Hives - Hives are raised, red patches of skin that are usually very itchy.  ?Lip or tongue swelling  ?Trouble swallowing or breathing  ?Blistering of the skin or mouth.  If you have any of these serious symptoms, please seek emergency medical care immediately.   Home care instructions:  Please follow any educational materials contained in this packet.   Follow-up instructions: Please follow-up with [facility] urgent [facility] on days 3, 7, and 14 for the meter of your vaccinations.  Return instructions:  Please return to the Emergency Department if you experience worsening symptoms.  Please return the emergency department  mediately if you develop any increasing swelling, redness, or drainage from the finger. Please return if you have any other emergent concerns.  Additional Information:   Your vital signs today were: BP 137/85    Pulse 98    Temp 98.8 F (37.1 C) (Oral)    Resp 18    Ht 5\' 5"  (1.651 m)    Wt 72.6 kg    LMP 08/12/2018    SpO2 97%    BMI 26.63 kg/m  If your blood pressure (BP) was elevated on multiple readings during this visit above 130 for the top number or above 80 for the bottom number, please have this repeated by your primary care provider within one month. --------------  Thank you for allowing [place] to participate in your care today.

## 2018-09-07 NOTE — ED Provider Notes (Signed)
[facility]-EMERGENCY DEPT Provider Note   CSN: [id number] Arrival date & time: 09/07/18  1356    History   Chief Complaint Chief Complaint  Patient presents with  . Animal Bite    HPI Denise L Cavazos is a 38 y.o. female.     HPI  Patient is a 38 year old female with no current medical problems presenting for animal bite to the right index finger.  Patient reports that she was bitten 48 hours ago by a stray dog that subsequently ran off.  Patient reports she has had no way to track this dog.  It appeared to be a stray with no collar.  Her tetanus shot is not up-to-date.  Patient reports that she washed it copiously and cleaned it out with alcohol, and it is not bothersome to her.  Patient denies any redness or drainage from the puncture wound.  She presents today for rabies vaccination.  Past Medical History:  Diagnosis Date  . Compartment syndrome of lower extremity (HCC) 2004  . Facial numbness   . GERD (gastroesophageal reflux disease)    with pregnancy  . Headache(784.0)   . Neck pain     Patient Active Problem List   Diagnosis Date Noted  . Migraine without aura and without status migrainosus, not intractable 08/30/2014  . Paresthesia 08/30/2014  . Neck pain on left side 08/30/2014  . Anemia of mother in pregnancy, delivered 09/15/2012  . Status post repeat low transverse cesarean section 09/13/2012  . Postpartum care following cesarean delivery 09/13/2012    Past Surgical History:  Procedure Laterality Date  . ANTERIOR COMPARTMENT DECOMPRESSION  2003  . ARTHROSCOPIC REPAIR ACL  [id number]  . CESAREAN SECTION  2011,2007  . CESAREAN SECTION N/A 09/13/2012   Procedure: CESAREAN SECTION;  Surgeon: Serita Kyle, MD;  Location: [facility];  Service: Obstetrics;  Laterality: N/A;  Repeat C/S  EDD: 09/20/12  . DILATATION & CURRETTAGE/HYSTEROSCOPY WITH RESECTOCOPE N/A 11/18/2012   Procedure: DILATATION & CURETTAGE/HYSTEROSCOPY WITH RESECTOCOPE;   Surgeon: Serita Kyle, MD;  Location: [facility];  Service: Gynecology;  Laterality: N/A;  . TONSILLECTOMY  2001     OB History    Gravida  5   Para  3   Term  3   Preterm  0   AB  2   Living  3     SAB  0   TAB  2   Ectopic  0   Multiple  0   Live Births  3            Home Medications    Prior to Admission medications   Medication Sig Start Date End Date Taking? Authorizing Provider  acetaminophen (TYLENOL) 500 MG tablet Take 1,000 mg by mouth every 6 (six) hours as needed for pain (back Pain).    [provider]  cetirizine (ZYRTEC) 10 MG tablet Take 10 mg by mouth daily.    [provider]  HYDROcodone-acetaminophen (NORCO/VICODIN) 5-325 MG per tablet Take 1 tablet by mouth 2 (two) times daily as needed. 03/07/15   [provider]  ibuprofen (ADVIL,MOTRIN) 600 MG tablet Take 1 tablet (600 mg total) by mouth every 6 (six) hours as needed. 09/15/12   Earl Gala, CNM  nortriptyline (PAMELOR) 10 MG capsule Take 2 tabs po qhs 06/15/16   Levert Feinstein, MD  rizatriptan (MAXALT-MLT) 10 MG disintegrating tablet Take 1 tablet (10 mg total) by mouth as needed. May repeat in 2 hours if needed 11/26/15  Nilda Riggs, NP  topiramate (TOPAMAX) 25 MG tablet Taking 2 tabs po qhs  (  total). 11/26/15   Nilda Riggs, NP    Family History Family History  Problem Relation Age of Onset  . Hypertension Mother   . Hypothyroidism Father     Social History Social History   Tobacco Use  . Smoking status: Never Smoker  . Smokeless tobacco: Never Used  Substance Use Topics  . Alcohol use: Yes    Alcohol/week: 0.0 standard drinks    Comment: Social use  . Drug use: No     Allergies   Codeine   Review of Systems Review of Systems  Constitutional: Negative for chills and fever.  Musculoskeletal: Positive for arthralgias and myalgias.  Skin: Positive for wound.  Neurological: Negative for weakness and numbness.      Physical Exam Updated Vital Signs BP 137/85   Pulse 98   Temp 98.8 F (37.1 C) (Oral)   Resp 18   Ht  (1.651 m)   Wt 72.6 kg   LMP 08/12/2018   SpO2 97%   BMI 26.63 kg/m   Physical Exam Vitals signs and nursing note reviewed.  Constitutional:      General: She is not in acute distress.    Appearance: She is well-developed. She is not diaphoretic.     Comments: Sitting comfortably in bed.  HENT:     Head: Normocephalic and atraumatic.  Eyes:     General:        Right eye: No discharge.        Left eye: No discharge.     Extraocular Movements: Extraocular movements intact.     Conjunctiva/sclera: Conjunctivae normal.     Pupils: Pupils are equal, round, and reactive to light.     Comments: EOMs normal to gross examination.  Neck:     Musculoskeletal: Normal range of motion.  Cardiovascular:     Rate and Rhythm: Normal rate and regular rhythm.     Comments: Intact, 2+  right radial pulse. Pulmonary:     Comments: Patient converses comfortably without audible wheeze or stridor. Abdominal:     General: There is no distension.  Musculoskeletal: Normal range of motion.     Comments: Right index finger exhibits well-healing, punctate abrasion over the dorsal aspect on the pulp of the tip of the finger.  No active drainage.  No surrounding erythema or swelling.  Skin:    General: Skin is warm and dry.  Neurological:     Mental Status: She is alert.     Comments: Cranial nerves intact to gross observation. Patient moves extremities without difficulty.  Psychiatric:        Behavior: Behavior normal.        Thought Content: Thought content normal.        Judgment: Judgment normal.      ED Treatments / Results  Labs (all labs ordered are listed, but only abnormal results are displayed) Labs Reviewed - No data to display  EKG None  Radiology Dg Finger Index Right  Result Date: 09/07/2018 CLINICAL DATA:  38 year old female bit by stray dog 4 days ago. EXAM:  RIGHT INDEX FINGER 2+V COMPARISON:  None. FINDINGS: No soft tissue gas. No radiopaque foreign body identified. Bone mineralization is within normal limits. Normal joint spaces and alignment. Osseous structures are intact. Mild soft tissue swelling. IMPRESSION: Negative aside from soft tissue swelling. Electronically Signed   By: Odessa Fleming M.D.   On: 09/07/2018  16:45    Procedures Procedures (including critical care time)  Medications Ordered in ED Medications - No data to display   Initial Impression / Assessment and Plan / ED Course  I have reviewed the triage vital signs and the nursing notes.  Pertinent labs & imaging results that were available during my care of the patient were reviewed by me and considered in my medical decision making (see chart for details).        Patient is well-appearing and neurovascularly intact in the right index finger.  Patient appears to have a well-healing wound after a dog bite 48 hours ago.  This was a stray dog with no ability to follow it for signs or symptoms of rabies.  Tetanus shot was administered, and patient was also given rabies vaccination.  She is instructed on the follow-up.  She is also given short course of prophylactic Augmentin.  Return precautions were given for any increasing swelling, pain or erythema.  Secondarily, patient had singular tachycardic episode on triage.  She reports that she has had a history of sinus tachycardia for which she takes beta-blockers.  She is not short of breath and does not have any palpitations at present.  Tachycardia normalized.  Final Clinical Impressions(s) / ED Diagnoses   Final diagnoses:  Dog bite, initial encounter  Encounter for prophylactic administration of rabies immune globulin    ED Discharge Orders         Ordered    amoxicillin-clavulanate (AUGMENTIN) 875-125 MG tablet  Every 12 hours     09/07/18 1752           Delia Chimes 09/07/18 1756    Charlynne Pander, MD  09/09/18 [phone]

## 2018-09-07 NOTE — ED Triage Notes (Signed)
Pt reports was bit on Saturday by a stray dog. Unsure if dog is vaccinated or not. Pt reports was painful then but now not so much painful. Pt was bit on right index finger.

## 2018-09-10 ENCOUNTER — Ambulatory Visit (HOSPITAL_COMMUNITY)
Admission: EM | Admit: 2018-09-10 | Discharge: 2018-09-10 | Disposition: A | Discharge status: Home / Self Care | Payer: BLUE CROSS/BLUE SHIELD | Attending: Family Medicine | Admitting: Family Medicine

## 2018-09-10 ENCOUNTER — Encounter (HOSPITAL_COMMUNITY): Payer: Self-pay

## 2018-09-10 DIAGNOSIS — Z23 Encounter for immunization: Secondary | ICD-10-CM | POA: Diagnosis not present

## 2018-09-10 DIAGNOSIS — Z203 Contact with and (suspected) exposure to rabies: Secondary | ICD-10-CM

## 2018-09-10 MED ORDER — RABIES VACCINE, PCEC IM SUSR
1.0000 mL | Freq: Once | INTRAMUSCULAR | Status: AC
  Administered 2018-09-10: 1 mL via INTRAMUSCULAR

## 2018-09-10 MED ORDER — RABIES VACCINE, PCEC IM SUSR
INTRAMUSCULAR | Status: AC
  Filled 2018-09-10: qty 1

## 2018-09-10 NOTE — ED Triage Notes (Signed)
Pt was recently seen for 1st rabies shot at the ED. She is her today to have 2 dose of vaccine.

## 2018-12-01 DIAGNOSIS — M5412 Radiculopathy, cervical region: Secondary | ICD-10-CM | POA: Diagnosis not present

## 2018-12-01 DIAGNOSIS — M542 Cervicalgia: Secondary | ICD-10-CM | POA: Diagnosis not present

## 2019-03-01 DIAGNOSIS — R030 Elevated blood-pressure reading, without diagnosis of hypertension: Secondary | ICD-10-CM | POA: Diagnosis not present

## 2019-03-01 DIAGNOSIS — Z6828 Body mass index (BMI) 28.0-28.9, adult: Secondary | ICD-10-CM | POA: Diagnosis not present

## 2019-03-01 DIAGNOSIS — M5412 Radiculopathy, cervical region: Secondary | ICD-10-CM | POA: Diagnosis not present

## 2019-03-01 DIAGNOSIS — M542 Cervicalgia: Secondary | ICD-10-CM | POA: Diagnosis not present

## 2019-04-25 DIAGNOSIS — H10413 Chronic giant papillary conjunctivitis, bilateral: Secondary | ICD-10-CM | POA: Diagnosis not present

## 2019-07-05 DIAGNOSIS — Z981 Arthrodesis status: Secondary | ICD-10-CM | POA: Diagnosis not present

## 2019-07-05 DIAGNOSIS — M5412 Radiculopathy, cervical region: Secondary | ICD-10-CM | POA: Diagnosis not present

## 2019-07-05 DIAGNOSIS — R030 Elevated blood-pressure reading, without diagnosis of hypertension: Secondary | ICD-10-CM | POA: Diagnosis not present

## 2019-07-05 DIAGNOSIS — M542 Cervicalgia: Secondary | ICD-10-CM | POA: Diagnosis not present

## 2019-07-05 DIAGNOSIS — Z79899 Other long term (current) drug therapy: Secondary | ICD-10-CM | POA: Diagnosis not present

## 2019-09-28 DIAGNOSIS — M542 Cervicalgia: Secondary | ICD-10-CM | POA: Diagnosis not present

## 2019-09-28 DIAGNOSIS — M5412 Radiculopathy, cervical region: Secondary | ICD-10-CM | POA: Diagnosis not present

## 2019-09-28 DIAGNOSIS — Z981 Arthrodesis status: Secondary | ICD-10-CM | POA: Diagnosis not present

## 2019-10-18 DIAGNOSIS — Z981 Arthrodesis status: Secondary | ICD-10-CM | POA: Diagnosis not present

## 2019-11-01 DIAGNOSIS — M5412 Radiculopathy, cervical region: Secondary | ICD-10-CM | POA: Diagnosis not present

## 2019-11-01 DIAGNOSIS — M4802 Spinal stenosis, cervical region: Secondary | ICD-10-CM | POA: Diagnosis not present

## 2019-11-01 DIAGNOSIS — M542 Cervicalgia: Secondary | ICD-10-CM | POA: Diagnosis not present

## 2019-11-09 DIAGNOSIS — Z124 Encounter for screening for malignant neoplasm of cervix: Secondary | ICD-10-CM | POA: Diagnosis not present

## 2019-11-09 DIAGNOSIS — Z1151 Encounter for screening for human papillomavirus (HPV): Secondary | ICD-10-CM | POA: Diagnosis not present

## 2019-11-09 DIAGNOSIS — N920 Excessive and frequent menstruation with regular cycle: Secondary | ICD-10-CM | POA: Diagnosis not present

## 2019-11-22 DIAGNOSIS — Z0000 Encounter for general adult medical examination without abnormal findings: Secondary | ICD-10-CM | POA: Diagnosis not present

## 2019-11-22 DIAGNOSIS — Z131 Encounter for screening for diabetes mellitus: Secondary | ICD-10-CM | POA: Diagnosis not present

## 2019-11-22 DIAGNOSIS — Z13220 Encounter for screening for lipoid disorders: Secondary | ICD-10-CM | POA: Diagnosis not present

## 2019-11-23 DIAGNOSIS — R87612 Low grade squamous intraepithelial lesion on cytologic smear of cervix (LGSIL): Secondary | ICD-10-CM | POA: Diagnosis not present

## 2019-11-23 DIAGNOSIS — Z3202 Encounter for pregnancy test, result negative: Secondary | ICD-10-CM | POA: Diagnosis not present

## 2019-12-05 DIAGNOSIS — M5412 Radiculopathy, cervical region: Secondary | ICD-10-CM | POA: Diagnosis not present

## 2019-12-06 DIAGNOSIS — D225 Melanocytic nevi of trunk: Secondary | ICD-10-CM | POA: Diagnosis not present

## 2019-12-06 DIAGNOSIS — D485 Neoplasm of uncertain behavior of skin: Secondary | ICD-10-CM | POA: Diagnosis not present

## 2019-12-15 DIAGNOSIS — Z803 Family history of malignant neoplasm of breast: Secondary | ICD-10-CM | POA: Diagnosis not present

## 2020-01-10 DIAGNOSIS — M5412 Radiculopathy, cervical region: Secondary | ICD-10-CM | POA: Diagnosis not present

## 2020-01-10 DIAGNOSIS — Z981 Arthrodesis status: Secondary | ICD-10-CM | POA: Diagnosis not present

## 2020-01-10 DIAGNOSIS — M4802 Spinal stenosis, cervical region: Secondary | ICD-10-CM | POA: Diagnosis not present

## 2020-03-07 DIAGNOSIS — M4802 Spinal stenosis, cervical region: Secondary | ICD-10-CM | POA: Diagnosis not present

## 2020-03-28 DIAGNOSIS — R000 Tachycardia, unspecified: Secondary | ICD-10-CM | POA: Diagnosis not present

## 2020-03-28 DIAGNOSIS — D473 Essential (hemorrhagic) thrombocythemia: Secondary | ICD-10-CM | POA: Diagnosis not present

## 2020-03-28 DIAGNOSIS — F418 Other specified anxiety disorders: Secondary | ICD-10-CM | POA: Diagnosis not present

## 2020-03-28 DIAGNOSIS — C539 Malignant neoplasm of cervix uteri, unspecified: Secondary | ICD-10-CM | POA: Diagnosis not present

## 2020-04-04 DIAGNOSIS — Z981 Arthrodesis status: Secondary | ICD-10-CM | POA: Diagnosis not present

## 2020-04-04 DIAGNOSIS — M4802 Spinal stenosis, cervical region: Secondary | ICD-10-CM | POA: Diagnosis not present

## 2020-04-04 DIAGNOSIS — M62838 Other muscle spasm: Secondary | ICD-10-CM | POA: Diagnosis not present

## 2020-05-02 DIAGNOSIS — F418 Other specified anxiety disorders: Secondary | ICD-10-CM | POA: Diagnosis not present

## 2020-05-02 DIAGNOSIS — F439 Reaction to severe stress, unspecified: Secondary | ICD-10-CM | POA: Diagnosis not present

## 2020-05-02 DIAGNOSIS — G43909 Migraine, unspecified, not intractable, without status migrainosus: Secondary | ICD-10-CM | POA: Diagnosis not present

## 2020-05-02 DIAGNOSIS — R000 Tachycardia, unspecified: Secondary | ICD-10-CM | POA: Diagnosis not present

## 2020-05-15 DIAGNOSIS — Z981 Arthrodesis status: Secondary | ICD-10-CM | POA: Diagnosis not present

## 2020-05-15 DIAGNOSIS — M62838 Other muscle spasm: Secondary | ICD-10-CM | POA: Diagnosis not present

## 2020-05-15 DIAGNOSIS — M5412 Radiculopathy, cervical region: Secondary | ICD-10-CM | POA: Diagnosis not present

## 2020-06-13 DIAGNOSIS — Z981 Arthrodesis status: Secondary | ICD-10-CM | POA: Diagnosis not present

## 2020-06-13 DIAGNOSIS — M5412 Radiculopathy, cervical region: Secondary | ICD-10-CM | POA: Diagnosis not present

## 2020-06-13 DIAGNOSIS — M4802 Spinal stenosis, cervical region: Secondary | ICD-10-CM | POA: Diagnosis not present

## 2020-06-29 DIAGNOSIS — S52501A Unspecified fracture of the lower end of right radius, initial encounter for closed fracture: Secondary | ICD-10-CM | POA: Diagnosis not present

## 2020-07-02 DIAGNOSIS — S52531D Colles' fracture of right radius, subsequent encounter for closed fracture with routine healing: Secondary | ICD-10-CM | POA: Diagnosis not present

## 2020-07-03 DIAGNOSIS — X58XXXA Exposure to other specified factors, initial encounter: Secondary | ICD-10-CM | POA: Diagnosis not present

## 2020-07-03 DIAGNOSIS — M25531 Pain in right wrist: Secondary | ICD-10-CM | POA: Diagnosis not present

## 2020-07-03 DIAGNOSIS — G5601 Carpal tunnel syndrome, right upper limb: Secondary | ICD-10-CM | POA: Diagnosis not present

## 2020-07-03 DIAGNOSIS — S52531D Colles' fracture of right radius, subsequent encounter for closed fracture with routine healing: Secondary | ICD-10-CM | POA: Diagnosis not present

## 2020-07-03 DIAGNOSIS — G8918 Other acute postprocedural pain: Secondary | ICD-10-CM | POA: Diagnosis not present

## 2020-07-03 DIAGNOSIS — S52571A Other intraarticular fracture of lower end of right radius, initial encounter for closed fracture: Secondary | ICD-10-CM | POA: Diagnosis not present

## 2020-07-09 DIAGNOSIS — S52501D Unspecified fracture of the lower end of right radius, subsequent encounter for closed fracture with routine healing: Secondary | ICD-10-CM | POA: Diagnosis not present

## 2020-07-18 ENCOUNTER — Other Ambulatory Visit: Payer: Self-pay | Admitting: Neurological Surgery

## 2020-07-18 DIAGNOSIS — M5412 Radiculopathy, cervical region: Secondary | ICD-10-CM | POA: Diagnosis not present

## 2020-07-18 DIAGNOSIS — S52501D Unspecified fracture of the lower end of right radius, subsequent encounter for closed fracture with routine healing: Secondary | ICD-10-CM | POA: Diagnosis not present

## 2020-07-18 DIAGNOSIS — S129XXA Fracture of neck, unspecified, initial encounter: Secondary | ICD-10-CM

## 2020-07-18 DIAGNOSIS — Z6827 Body mass index (BMI) 27.0-27.9, adult: Secondary | ICD-10-CM | POA: Diagnosis not present

## 2020-07-18 DIAGNOSIS — R030 Elevated blood-pressure reading, without diagnosis of hypertension: Secondary | ICD-10-CM | POA: Diagnosis not present

## 2020-07-19 ENCOUNTER — Other Ambulatory Visit: Payer: Self-pay

## 2020-07-19 ENCOUNTER — Ambulatory Visit
Admission: RE | Admit: 2020-07-19 | Discharge: 2020-07-19 | Disposition: A | Discharge status: Home / Self Care | Payer: BC Managed Care – PPO | Source: Ambulatory Visit | Attending: Neurological Surgery | Admitting: Neurological Surgery

## 2020-07-19 DIAGNOSIS — S129XXA Fracture of neck, unspecified, initial encounter: Secondary | ICD-10-CM

## 2020-07-19 DIAGNOSIS — M4602 Spinal enthesopathy, cervical region: Secondary | ICD-10-CM | POA: Diagnosis not present

## 2020-07-19 DIAGNOSIS — M2578 Osteophyte, vertebrae: Secondary | ICD-10-CM | POA: Diagnosis not present

## 2020-07-19 DIAGNOSIS — M4802 Spinal stenosis, cervical region: Secondary | ICD-10-CM | POA: Diagnosis not present

## 2020-07-19 DIAGNOSIS — M25512 Pain in left shoulder: Secondary | ICD-10-CM | POA: Diagnosis not present

## 2020-07-23 DIAGNOSIS — Z09 Encounter for follow-up examination after completed treatment for conditions other than malignant neoplasm: Secondary | ICD-10-CM | POA: Diagnosis not present

## 2020-07-24 DIAGNOSIS — M5412 Radiculopathy, cervical region: Secondary | ICD-10-CM | POA: Diagnosis not present

## 2020-07-24 DIAGNOSIS — M4802 Spinal stenosis, cervical region: Secondary | ICD-10-CM | POA: Diagnosis not present

## 2020-07-24 DIAGNOSIS — S129XXA Fracture of neck, unspecified, initial encounter: Secondary | ICD-10-CM | POA: Diagnosis not present

## 2020-07-25 DIAGNOSIS — S52501D Unspecified fracture of the lower end of right radius, subsequent encounter for closed fracture with routine healing: Secondary | ICD-10-CM | POA: Diagnosis not present

## 2020-08-01 DIAGNOSIS — D473 Essential (hemorrhagic) thrombocythemia: Secondary | ICD-10-CM | POA: Diagnosis not present

## 2020-08-01 DIAGNOSIS — R000 Tachycardia, unspecified: Secondary | ICD-10-CM | POA: Diagnosis not present

## 2020-08-01 DIAGNOSIS — G43909 Migraine, unspecified, not intractable, without status migrainosus: Secondary | ICD-10-CM | POA: Diagnosis not present

## 2020-08-01 DIAGNOSIS — F418 Other specified anxiety disorders: Secondary | ICD-10-CM | POA: Diagnosis not present

## 2020-08-02 DIAGNOSIS — S52501D Unspecified fracture of the lower end of right radius, subsequent encounter for closed fracture with routine healing: Secondary | ICD-10-CM | POA: Diagnosis not present

## 2020-08-09 DIAGNOSIS — S52501D Unspecified fracture of the lower end of right radius, subsequent encounter for closed fracture with routine healing: Secondary | ICD-10-CM | POA: Diagnosis not present

## 2020-08-13 DIAGNOSIS — S52501D Unspecified fracture of the lower end of right radius, subsequent encounter for closed fracture with routine healing: Secondary | ICD-10-CM | POA: Diagnosis not present

## 2020-08-15 ENCOUNTER — Other Ambulatory Visit: Payer: Self-pay

## 2020-08-15 ENCOUNTER — Encounter: Payer: Self-pay | Admitting: Adult Health

## 2020-08-15 ENCOUNTER — Ambulatory Visit (INDEPENDENT_AMBULATORY_CARE_PROVIDER_SITE_OTHER): Payer: BC Managed Care – PPO | Admitting: Adult Health

## 2020-08-15 VITALS — BP 135/89 | HR 103 | Ht 65.0 in | Wt 158.0 lb

## 2020-08-15 DIAGNOSIS — F4329 Adjustment disorder with other symptoms: Secondary | ICD-10-CM | POA: Diagnosis not present

## 2020-08-15 DIAGNOSIS — F411 Generalized anxiety disorder: Secondary | ICD-10-CM | POA: Diagnosis not present

## 2020-08-15 DIAGNOSIS — J329 Chronic sinusitis, unspecified: Secondary | ICD-10-CM | POA: Insufficient documentation

## 2020-08-15 DIAGNOSIS — F419 Anxiety disorder, unspecified: Secondary | ICD-10-CM | POA: Insufficient documentation

## 2020-08-15 DIAGNOSIS — G4700 Insomnia, unspecified: Secondary | ICD-10-CM

## 2020-08-15 DIAGNOSIS — F4320 Adjustment disorder, unspecified: Secondary | ICD-10-CM | POA: Insufficient documentation

## 2020-08-15 DIAGNOSIS — B370 Candidal stomatitis: Secondary | ICD-10-CM | POA: Insufficient documentation

## 2020-08-15 DIAGNOSIS — K219 Gastro-esophageal reflux disease without esophagitis: Secondary | ICD-10-CM | POA: Insufficient documentation

## 2020-08-15 DIAGNOSIS — F331 Major depressive disorder, recurrent, moderate: Secondary | ICD-10-CM

## 2020-08-15 DIAGNOSIS — G43909 Migraine, unspecified, not intractable, without status migrainosus: Secondary | ICD-10-CM | POA: Insufficient documentation

## 2020-08-15 MED ORDER — ESCITALOPRAM OXALATE 20 MG PO TABS: 20.0000 mg | ORAL_TABLET | Freq: Every day | ORAL | 5 refills | Status: DC

## 2020-08-15 MED ORDER — HYDROXYZINE HCL 25 MG PO TABS: 25.0000 mg | ORAL_TABLET | Freq: Two times a day (BID) | ORAL | 5 refills | Status: DC

## 2020-08-15 MED ORDER — ALPRAZOLAM 1 MG PO TABS: 1.0000 mg | ORAL_TABLET | Freq: Two times a day (BID) | ORAL | 2 refills | Status: DC

## 2020-08-15 NOTE — Addendum Note (Signed)
Addended by: Dorothyann Gibbs on: 08/15/2020 04:00 PM   Modules accepted: Orders

## 2020-08-15 NOTE — Progress Notes (Signed)
[facility] MD/PA/NP Initial Note  08/15/2020 3:53 PM Mary King  MRN:  [id number]  Chief Complaint:   HPI:   Describes mood today as "not so good". Pleasant. Tearful throughout interview. Mood symptoms - reports depression, anxiety, and irritability. Feels tired, unmotivated, difficulties with focus and concentration - effecting my work and home life. Stating "I can't seem to function". Having an panic attacks every morning and 4 to 5 times a day. Stating "the one in the morning is the worst". Struggles to get herself ready and be at work by 9. Having to find alternatives for getting children back and forth to school. Has been back in the office for two days - working remotely for past 2 years. Has not been getting out of the house much. Was hoping going back to work would be a good thing for her, "but it's not". Stating "I have become a hermit". She and husband having issues. Increased stressors in work setting. Stating "I feel like everyone is fussing at me". Feels "overwhelmed". Taking medications as prescribed, but does not feel like they are helping. Decreased interest and motivation.  Energy levels low.  Active, does not have a regular exercise routine.  Enjoys some usual interests and activities. Married. Lives with husband and 3 children. Family local and supportive. Spending time with family. Appetite decreased. Weight loss 25 pounds in a month - stress related. Was in a 10 now in a size 4.  Sleeps well most nights. Averages 4 to 5 hours. Focus and concentration stable. Completing tasks. Managing aspects of household. 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  Denies AH or VH.  Previous medication trials: Zoloft 50mg  daily (25mg  to 100mg ), Clonazepam  Visit Diagnosis:    ICD-10-CM   1. Insomnia, unspecified type  G47.00   2. Major depressive disorder, recurrent episode, moderate (HCC)  F33.1   3. Generalized anxiety disorder  F41.1   4. Adjustment  disorder with other symptom  F43.29     Past Psychiatric History: Denies psychiatric hospitalization.  Past Medical History:  Past Medical History:  Diagnosis Date  . Compartment syndrome of lower extremity (HCC) 2004  . Facial numbness   . GERD (gastroesophageal reflux disease)    with pregnancy  . Headache(784.0)   . Neck pain     Past Surgical History:  Procedure Laterality Date  . ANTERIOR COMPARTMENT DECOMPRESSION  2003  . ARTHROSCOPIC REPAIR ACL   . CESAREAN SECTION  2011,2007  . CESAREAN SECTION N/A 09/13/2012   Procedure: CESAREAN SECTION;  Surgeon: [id number], MD;  Location: [facility];  Service: Obstetrics;  Laterality: N/A;  Repeat C/S  EDD: 09/20/12  . DILATATION & CURRETTAGE/HYSTEROSCOPY WITH RESECTOCOPE N/A 11/18/2012   Procedure: DILATATION & CURETTAGE/HYSTEROSCOPY WITH RESECTOCOPE;  Surgeon: Serita Kyle, MD;  Location: [facility];  Service: Gynecology;  Laterality: N/A;  . TONSILLECTOMY  2001    Family Psychiatric History: Denies any family history of mental illness.  Family History:  Family History  Problem Relation Age of Onset  . Hypertension Mother   . Hypothyroidism Father     Social History:  Social History   Socioeconomic History  . Marital status: Married    Spouse name: Shawn  . Number of children: 3  . Years of education: College  . Highest education level: Not on file  Occupational History  . Occupation: 11/20/2012 at [employer]  Tobacco Use  . Smoking status: Never Smoker  . Smokeless  tobacco: Never Used  Vaping Use  . Vaping Use: Never used  Substance and Sexual Activity  . Alcohol use: Yes    Alcohol/week: 0.0 standard drinks    Comment: Social use  . Drug use: No  . Sexual activity: Not on file  Other Topics Concern  . Not on file  Social History Narrative   Right handed.   Lives at home with husband and 3 children.   2 cups/day   Social Determinants of Health   Financial Resource Strain: Not on file   Food Insecurity: Not on file  Transportation Needs: Not on file  Physical Activity: Not on file  Stress: Not on file  Social Connections: Not on file    Allergies:  Allergies  Allergen Reactions  . Codeine Nausea Only    Metabolic Disorder Labs: No results found for: HGBA1C, MPG No results found for: PROLACTIN No results found for: CHOL, TRIG, HDL, CHOLHDL, VLDL, LDLCALC No results found for: TSH  Therapeutic Level Labs: No results found for: LITHIUM No results found for: VALPROATE No components found for:  CBMZ  Current Medications: No current outpatient medications on file.   No current facility-administered medications for this visit.    Medication Side Effects: none  Orders placed this visit:  No orders of the defined types were placed in this encounter.   Psychiatric Specialty Exam:  Review of Systems  Musculoskeletal: Negative for gait problem.  Neurological: Negative for tremors.  Psychiatric/Behavioral:       Please refer to HPI    Blood pressure 135/89, pulse (!) 103, height 5\' 5"  (1.651 m), weight 158 lb (71.7 kg).Body mass index is 26.29 kg/m.  General Appearance: Neat and Well Groomed  Eye Contact:  Good  Speech:  Clear and Coherent and Normal Rate  Volume:  Normal  Mood:  Anxious and Depressed  Affect:  Appropriate, Congruent and Tearful  Thought Process:  Coherent and Descriptions of Associations: Intact  Orientation:  Full (Time, Place, and Person)  Thought Content: Logical   Suicidal Thoughts:  No  Homicidal Thoughts:  No  Memory:  WNL  Judgement:  Good  Insight:  Good  Psychomotor Activity:  Normal  Concentration:  Concentration: Good  Recall:  Good  Fund of Knowledge: Good  Language: Good  Assets:  Communication Skills Desire for Improvement Financial Resources/Insurance Housing Intimacy Leisure Time Physical Health Resilience Social Support Talents/Skills Transportation Vocational/Educational  ADL's:  Intact  Cognition:  WNL  Prognosis:  Good   Screenings: MDQ  Receiving Psychotherapy: No - wanting too.  Treatment Plan/Recommendations:   Plan:  PDMP reviewed  1. D/C Clonazepam 1mg  BID. 2. Add Xanax 1mg  BID. 3. D/C Zoloft 50mg . 4. Add Lexapro 20mg  daily - 1/2 tablet daily x 7 days, then one tablet daily. 5. Add Hydroxyzine 25mg  at hs.  Read and reviewed note with patient for accuracy.   RTC 4 weeks  Patient advised to contact office with any questions, adverse effects, or acute worsening in signs and symptoms.  Out of work 08/16/2020.     , NP

## 2020-08-26 ENCOUNTER — Encounter: Payer: Self-pay | Admitting: Adult Health

## 2020-08-26 ENCOUNTER — Other Ambulatory Visit: Payer: Self-pay

## 2020-08-26 ENCOUNTER — Ambulatory Visit (INDEPENDENT_AMBULATORY_CARE_PROVIDER_SITE_OTHER): Payer: BC Managed Care – PPO | Admitting: Adult Health

## 2020-08-26 DIAGNOSIS — G4700 Insomnia, unspecified: Secondary | ICD-10-CM | POA: Diagnosis not present

## 2020-08-26 DIAGNOSIS — F411 Generalized anxiety disorder: Secondary | ICD-10-CM

## 2020-08-26 DIAGNOSIS — F4329 Adjustment disorder with other symptoms: Secondary | ICD-10-CM | POA: Diagnosis not present

## 2020-08-26 DIAGNOSIS — F331 Major depressive disorder, recurrent, moderate: Secondary | ICD-10-CM

## 2020-08-26 MED ORDER — ALPRAZOLAM 1 MG PO TABS: ORAL_TABLET | ORAL | 2 refills | Status: DC

## 2020-08-26 NOTE — Progress Notes (Signed)
Mary King [id number] Jun 19, 1981 40 y.o.  Subjective:   Patient ID:  Mary King is a 40 y.o. (DOB 12/15/1980) female.  Chief Complaint: No chief complaint on file.   HPI Mary King presents to the office today for follow-up of MDD, GAD, insomnia, adjustment disorder and panic attacks.  Describes mood today as "a little better". Pleasant. Decreased tearfulness. Mood symptoms - reports decreased depression, anxiety, and irritability. Stating "the new medication is helping me". Decreased feelings of fatigue, lack of motivation, and difficulties with focus and concentration. Stating "I can function a little better - it's a slow process". Able to talk to people more instead of "hiding". Continues to experience panic attacks every morning 5 out of 7 days - "have them generally, but can't pinpoint why". She and husband having issues. Increased stressors in work setting. Stating "I feel like everyone is fussing at me". Feels "overwhelmed". Taking medications as prescribed, and feels like they are helping. Improving interest and motivation.  Energy levels "getting better". Cleaned house yesterday - first time on months. Active, does not have a regular exercise routine.  Enjoys some usual interests and activities. Married. Lives with husband and 3 children - "ball season". Family local and supportive. Spending time with family. Appetite decreased. Weight loss "leveling off" - 156 pounds. Was in a 10 now in a size 4.  Sleeps well most nights. Averages 5 hours. Focus and concentration stable. Completing tasks. Managing aspects of household. 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  Denies AH or VH.  Previous medication trials: Zoloft 50mg  daily (25mg  to 100mg ), Clonazepam     Review of Systems:  Review of Systems  Musculoskeletal: Negative for gait problem.  Neurological: Negative for tremors.  Psychiatric/Behavioral:       Please refer to HPI     Medications: I have reviewed the patient's current medications.  Current Outpatient Medications  Medication Sig Dispense Refill  . ALPRAZolam (XANAX) 1 MG tablet Take one tablet up to 4 times daily as needed for anxiety and panic attacks. 120 tablet 2  . escitalopram (LEXAPRO) 20 MG tablet Take 1 tablet (20 mg total) by mouth daily. 30 tablet 5  . hydrOXYzine (ATARAX/VISTARIL) 25 MG tablet Take 1 tablet (25 mg total) by mouth 2 (two) times daily. 60 tablet 5   No current facility-administered medications for this visit.    Medication Side Effects: None  Allergies:  Allergies  Allergen Reactions  . Codeine Nausea Only    Past Medical History:  Diagnosis Date  . Compartment syndrome of lower extremity (HCC) 2004  . Facial numbness   . GERD (gastroesophageal reflux disease)    with pregnancy  . Headache(784.0)   . Neck pain     Family History  Problem Relation Age of Onset  . Hypertension Mother   . Hypothyroidism Father     Social History   Socioeconomic History  . Marital status: Married    Spouse name: Shawn  . Number of children: 3  . Years of education: College  . Highest education level: Not on file  Occupational History  . Occupation: at [employer]  Tobacco Use  . Smoking status: Never Smoker  . Smokeless tobacco: Never Used  Vaping Use  . Vaping Use: Never used  Substance and Sexual Activity  . Alcohol use: Yes    Alcohol/week: 0.0 standard drinks    Comment: Social use  . Drug use: No  . Sexual  activity: Not on file  Other Topics Concern  . Not on file  Social History Narrative   Right handed.   Lives at home with husband and 3 children.   2 cups/day   Social Determinants of Health   Financial Resource Strain: Not on file  Food Insecurity: Not on file  Transportation Needs: Not on file  Physical Activity: Not on file  Stress: Not on file  Social Connections: Not on file  Intimate Partner Violence: Not on file    Past  Medical History, Surgical history, Social history, and Family history were reviewed and updated as appropriate.   Please see review of systems for further details on the patient's review from today.   Objective:   Physical Exam:  There were no vitals taken for this visit.  Physical Exam Constitutional:      General: She is not in acute distress. Musculoskeletal:        General: No deformity.  Neurological:     Mental Status: She is alert and oriented to person, place, and time.     Coordination: Coordination normal.  Psychiatric:        Attention and Perception: Attention and perception normal. She does not perceive auditory or visual hallucinations.        Mood and Affect: Mood normal. Mood is not anxious or depressed. Affect is not labile, blunt, angry or inappropriate.        Speech: Speech normal.        Behavior: Behavior normal.        Thought Content: Thought content normal. Thought content is not paranoid or delusional. Thought content does not include homicidal or suicidal ideation. Thought content does not include homicidal or suicidal plan.        Cognition and Memory: Cognition and memory normal.        Judgment: Judgment normal.     Comments: Insight intact     Lab Review:  No results found for: NA, K, CL, CO2, GLUCOSE, BUN, CREATININE, CALCIUM, PROT, ALBUMIN, AST, ALT, ALKPHOS, BILITOT, GFRNONAA, GFRAA     Component Value Date/Time   WBC 6.6 11/18/2012 1245   RBC 3.87 11/18/2012 1245   HGB 12.7 11/18/2012 1245   HCT 38.6 11/18/2012 1245   PLT 359 11/18/2012 1245   MCV 99.7 11/18/2012 1245   MCH 32.8 11/18/2012 1245   MCHC 32.9 11/18/2012 1245   RDW 12.8 11/18/2012 1245    No results found for: POCLITH, LITHIUM   No results found for: PHENYTOIN, PHENOBARB, VALPROATE, CBMZ   .res Assessment: Plan:    Plan:  PDMP reviewed  1. Lexapro 20mg  daily 2. Xanax 1mg  BID to 4 times a day. 3. Hydroxyzine 25mg  at hs.  Read and reviewed note with patient for  accuracy.   RTC 4 weeks  Patient advised to contact office with any questions, adverse effects, or acute worsening in signs and symptoms.  Discussed potential benefits, risk, and side effects of benzodiazepines to include potential risk of tolerance and dependence, as well as possible drowsiness.  Advised patient not to drive if experiencing drowsiness and to take lowest possible effective dose to minimize risk of dependence and tolerance.   Diagnoses and all orders for this visit:  Adjustment disorder with other symptom  Generalized anxiety disorder  Major depressive disorder, recurrent episode, moderate (HCC)  Insomnia, unspecified type -     ALPRAZolam (XANAX) 1 MG tablet; Take one tablet up to 4 times daily as needed for anxiety and panic attacks.  Please see After Visit Summary for patient specific instructions.  No future appointments.  No orders of the defined types were placed in this encounter.   -------------------------------

## 2020-09-16 DIAGNOSIS — M5412 Radiculopathy, cervical region: Secondary | ICD-10-CM | POA: Diagnosis not present

## 2020-09-16 DIAGNOSIS — M4802 Spinal stenosis, cervical region: Secondary | ICD-10-CM | POA: Diagnosis not present

## 2020-09-19 DIAGNOSIS — Z6826 Body mass index (BMI) 26.0-26.9, adult: Secondary | ICD-10-CM | POA: Diagnosis not present

## 2020-09-19 DIAGNOSIS — S129XXA Fracture of neck, unspecified, initial encounter: Secondary | ICD-10-CM | POA: Diagnosis not present

## 2020-09-19 DIAGNOSIS — M4802 Spinal stenosis, cervical region: Secondary | ICD-10-CM | POA: Diagnosis not present

## 2020-09-19 DIAGNOSIS — F1120 Opioid dependence, uncomplicated: Secondary | ICD-10-CM | POA: Diagnosis not present

## 2020-09-19 DIAGNOSIS — M5412 Radiculopathy, cervical region: Secondary | ICD-10-CM | POA: Diagnosis not present

## 2020-09-23 ENCOUNTER — Ambulatory Visit (INDEPENDENT_AMBULATORY_CARE_PROVIDER_SITE_OTHER): Payer: BC Managed Care – PPO | Admitting: Adult Health

## 2020-09-23 ENCOUNTER — Encounter: Payer: Self-pay | Admitting: Adult Health

## 2020-09-23 ENCOUNTER — Other Ambulatory Visit: Payer: Self-pay

## 2020-09-23 DIAGNOSIS — F331 Major depressive disorder, recurrent, moderate: Secondary | ICD-10-CM

## 2020-09-23 DIAGNOSIS — F411 Generalized anxiety disorder: Secondary | ICD-10-CM

## 2020-09-23 DIAGNOSIS — F410 Panic disorder [episodic paroxysmal anxiety] without agoraphobia: Secondary | ICD-10-CM | POA: Diagnosis not present

## 2020-09-23 DIAGNOSIS — G4700 Insomnia, unspecified: Secondary | ICD-10-CM

## 2020-09-23 MED ORDER — BUPROPION HCL ER (XL) 150 MG PO TB24: ORAL_TABLET | ORAL | 2 refills | Status: DC

## 2020-09-23 NOTE — Progress Notes (Signed)
Mary King [id number] 03-18-1981 39 y.o.  Subjective:   Patient ID:  Mary King is a 40 y.o. (DOB 06-01-1981) female.  Chief Complaint: No chief complaint on file.   HPI    Mary King presents to the office today for follow-up of MDD, GAD, insomnia, adjustment disorder and panic attacks.  Describes mood today as "not good". Pleasant, but flat. Increased tearfulness. Mood symptoms - reports depression, anxiety, and irritability. Reporting panic attacks. Feels "very" overwhelmed. Stating "I felt like I was doing better". Feels like she may have returned to work too early. Stating "within a few days of returning to work, I just broke down". Also stating "it was too much too soon". Feels like job is stressful and overwhelming. Feels bad she can't do her job - "I'm failing everybody". Opens an e-mail and "freaks" out. Has not been able to shower for past 2 weeks. She and husband having issues in marriage. Taking medications as prescribed, and feels like they are helping. Improving interest and motivation.  Energy levels "up and down". Cleaned house yesterday - first time on months. Active, does not have a regular exercise routine.  Enjoys some usual interests and activities. Married. Lives with husband and 3 children. Family local and supportive. Spending time with family. Appetite decreased. Weight loss - 6 pounds - 150 pounds - size 4 clothes too big.  Sleeps better some nights than others - mind racing. Averages 5 to 6 hours. Focus and concentration difficulties - "feels overwhelmed". Completing tasks. Managing some aspects of household - "just some mindless stuff". 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. Denies SI or HI.  Denies AH or VH.  Previous medication trials: Zoloft 50mg  daily (25mg  to 100mg ), Clonazepam  Review of Systems:  Review of Systems  Musculoskeletal: Negative for gait problem.  Neurological: Negative for tremors.   Psychiatric/Behavioral:       Please refer to HPI    Medications: I have reviewed the patient's current medications.  Current Outpatient Medications  Medication Sig Dispense Refill  . ALPRAZolam (XANAX) 1 MG tablet Take one tablet up to 4 times daily as needed for anxiety and panic attacks. 120 tablet 2  . buPROPion (WELLBUTRIN XL) 150 MG 24 hr tablet Take one tablet every morning for 7 days, then increase to two tablets every morning. 60 tablet 2  . escitalopram (LEXAPRO) 20 MG tablet Take 1 tablet (20 mg total) by mouth daily. 30 tablet 5  . hydrOXYzine (ATARAX/VISTARIL) 25 MG tablet Take 1 tablet (25 mg total) by mouth 2 (two) times daily. 60 tablet 5   No current facility-administered medications for this visit.    Medication Side Effects: None  Allergies:  Allergies  Allergen Reactions  . Codeine Nausea Only    Past Medical History:  Diagnosis Date  . Compartment syndrome of lower extremity (HCC) 2004  . Facial numbness   . GERD (gastroesophageal reflux disease)    with pregnancy  . Headache(784.0)   . Neck pain     Family History  Problem Relation Age of Onset  . Hypertension Mother   . Hypothyroidism Father     Social History   Socioeconomic History  . Marital status: Married    Spouse name: Shawn  . Number of children: 3  . Years of education: College  . Highest education level: Not on file  Occupational History  . Occupation: at [employer]  Tobacco Use  . Smoking status:  Never Smoker  . Smokeless tobacco: Never Used  Vaping Use  . Vaping Use: Never used  Substance and Sexual Activity  . Alcohol use: Yes    Alcohol/week: 0.0 standard drinks    Comment: Social use  . Drug use: No  . Sexual activity: Not on file  Other Topics Concern  . Not on file  Social History Narrative   Right handed.   Lives at home with husband and 3 children.   2 cups/day   Social Determinants of Health   Financial Resource Strain: Not on file  Food  Insecurity: Not on file  Transportation Needs: Not on file  Physical Activity: Not on file  Stress: Not on file  Social Connections: Not on file  Intimate Partner Violence: Not on file    Past Medical History, Surgical history, Social history, and Family history were reviewed and updated as appropriate.   Please see review of systems for further details on the patient's review from today.   Objective:   Physical Exam:  There were no vitals taken for this visit.  Physical Exam Constitutional:      General: She is not in acute distress. Musculoskeletal:        General: No deformity.  Neurological:     Mental Status: She is alert and oriented to person, place, and time.     Coordination: Coordination normal.  Psychiatric:        Attention and Perception: Attention and perception normal. She does not perceive auditory or visual hallucinations.        Mood and Affect: Mood normal. Mood is not anxious or depressed. Affect is not labile, blunt, angry or inappropriate.        Speech: Speech normal.        Behavior: Behavior normal.        Thought Content: Thought content normal. Thought content is not paranoid or delusional. Thought content does not include homicidal or suicidal ideation. Thought content does not include homicidal or suicidal plan.        Cognition and Memory: Cognition and memory normal.        Judgment: Judgment normal.     Comments: Insight intact     Lab Review:  No results found for: NA, K, CL, CO2, GLUCOSE, BUN, CREATININE, CALCIUM, PROT, ALBUMIN, AST, ALT, ALKPHOS, BILITOT, GFRNONAA, GFRAA     Component Value Date/Time   WBC 6.6 11/18/2012 1245   RBC 3.87 11/18/2012 1245   HGB 12.7 11/18/2012 1245   HCT 38.6 11/18/2012 1245   PLT 359 11/18/2012 1245   MCV 99.7 11/18/2012 1245   MCH 32.8 11/18/2012 1245   MCHC 32.9 11/18/2012 1245   RDW 12.8 11/18/2012 1245    No results found for: POCLITH, LITHIUM   No results found for: PHENYTOIN, PHENOBARB,  VALPROATE, CBMZ   .res Assessment: Plan:    Plan:  PDMP reviewed  1. Lexapro 20mg  daily 2. Xanax 1mg  BID to 4 times a day. 3. Hydroxyzine 25mg  at hs. 4. Add Wellbutrin XL 150mg  daily x 7 days, then increase to 300mg  daily. Denies seizure history.  Out of work 09/23/2020 to 10/21/2020.  RTC 4 weeks  Patient advised to contact office with any questions, adverse effects, or acute worsening in signs and symptoms.  Discussed potential benefits, risk, and side effects of benzodiazepines to include potential risk of tolerance and dependence, as well as possible drowsiness.  Advised patient not to drive if experiencing drowsiness and to take lowest possible effective dose to  minimize risk of dependence and tolerance.   Diagnoses and all orders for this visit:  Major depressive disorder, recurrent episode, moderate (HCC) -     buPROPion (WELLBUTRIN XL) 150 MG 24 hr tablet; Take one tablet every morning for 7 days, then increase to two tablets every morning.  Insomnia, unspecified type  Generalized anxiety disorder  Panic attacks     Please see After Visit Summary for patient specific instructions.  No future appointments.  No orders of the defined types were placed in this encounter.   -------------------------------

## 2020-09-24 DIAGNOSIS — S52501D Unspecified fracture of the lower end of right radius, subsequent encounter for closed fracture with routine healing: Secondary | ICD-10-CM | POA: Diagnosis not present

## 2020-09-30 ENCOUNTER — Telehealth: Payer: Self-pay | Admitting: Adult Health

## 2020-09-30 NOTE — Telephone Encounter (Signed)
When was her last dose? Did she pick the pills up? Can she bring them in?

## 2020-09-30 NOTE — Telephone Encounter (Signed)
Rtc to pt and she reports spilling all of her Xanax on the bathroom floor at baseball game. She picked up #120 on 3/24 and is out. Informed her I would discuss with Rene Kocher but with being a controlled substance that she recently picked up I wasn't sure the options.

## 2020-09-30 NOTE — Telephone Encounter (Signed)
Tried to reach pt again but went to voicemail

## 2020-09-30 NOTE — Telephone Encounter (Signed)
Pt called stating at son's ball game. Started having panic attack, went to bathroom to try and get herself together. Having a hard time getting bottle of meds opened,  when she did the pills went everywhere. All over that nasty bathroom floor and toilet. Asking what to do about this, if anything can be done. Contact @ [phone]. Apt 4/26

## 2020-09-30 NOTE — Telephone Encounter (Signed)
She reports flushing them all down the toilet. I will check with her on the last dose. She reports just being "out"

## 2020-09-30 NOTE — Telephone Encounter (Signed)
Noted  

## 2020-10-01 ENCOUNTER — Telehealth: Payer: Self-pay | Admitting: Adult Health

## 2020-10-01 NOTE — Telephone Encounter (Signed)
Able to speak with pharmacist and took early refill. Pt aware

## 2020-10-01 NOTE — Telephone Encounter (Signed)
Spoke with pt previously from other phone msg. Okay to refill Xanax 1 mg early. Pharmacy aware.

## 2020-10-01 NOTE — Telephone Encounter (Signed)
Spoke with pharmacy and they said pt could have an early refill but wanted provider to okay it. Pt will have to pay out of pocket as well.

## 2020-10-01 NOTE — Telephone Encounter (Signed)
Pt was told to call first thing in the am to tell traci that the last xanax she had was on Sunday am between 6:30 am and 7:00 am

## 2020-10-01 NOTE — Telephone Encounter (Signed)
Noted  

## 2020-10-01 NOTE — Telephone Encounter (Signed)
Trying to reach her pharmacy but been put on hold multiple times.

## 2020-10-01 NOTE — Telephone Encounter (Signed)
Pt called to check status on Xanax. Asking Traci to return call [phone]

## 2020-10-01 NOTE — Telephone Encounter (Signed)
Pt called back with info

## 2020-10-07 DIAGNOSIS — Z0289 Encounter for other administrative examinations: Secondary | ICD-10-CM

## 2020-10-22 ENCOUNTER — Encounter: Payer: Self-pay | Admitting: Adult Health

## 2020-10-22 ENCOUNTER — Ambulatory Visit (INDEPENDENT_AMBULATORY_CARE_PROVIDER_SITE_OTHER): Payer: BC Managed Care – PPO | Admitting: Adult Health

## 2020-10-22 ENCOUNTER — Other Ambulatory Visit: Payer: Self-pay

## 2020-10-22 DIAGNOSIS — G4700 Insomnia, unspecified: Secondary | ICD-10-CM

## 2020-10-22 DIAGNOSIS — F411 Generalized anxiety disorder: Secondary | ICD-10-CM

## 2020-10-22 DIAGNOSIS — F410 Panic disorder [episodic paroxysmal anxiety] without agoraphobia: Secondary | ICD-10-CM | POA: Diagnosis not present

## 2020-10-22 DIAGNOSIS — F331 Major depressive disorder, recurrent, moderate: Secondary | ICD-10-CM

## 2020-10-22 MED ORDER — ALPRAZOLAM 1 MG PO TABS: ORAL_TABLET | ORAL | 2 refills | Status: DC

## 2020-10-22 NOTE — Progress Notes (Signed)
Mary King [id number] 03-02-1981 40 y.o.  Subjective:   Patient ID:  Mary King is a 40 y.o. (DOB 1980/11/19) female.  Chief Complaint: No chief complaint on file.   HPI Mary King presents to the office today for follow-up of  MDD, GAD, insomnia, adjustment disorder and panic attacks.  Describes mood today as "better". Pleasant. Decreased tearfulness. Mood symptoms - reports decreased depression, anxiety, and irritability. Feels like addition of Wellbutrin has been helpful. Reporting panic attacks - "still there". She and husband have decided to separate. Stating "I got the courage up to talk to him". Feels overwhelmed at times. Would like to try and return to work. Taking medications as prescribed, and feels like they are helping. Improving interest and motivation.  Energy levels improved. Active, does not have a regular exercise routine.  Enjoys some usual interests and activities. Married, plans to separate. Lives with husband and 3 children. Family local and supportive. Spending time with family. Appetite improved. Weight stabilizing - 150 pounds. Sleep has improved - mind not racing as much - "getting a hold of it". Averages 6.5 hours. Focus and concentration getting better - "not feeling as overwhelmed". Completing tasks. Managing some aspects of household. 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  Denies AH or VH.  Previous medication trials: Zoloft 50mg  daily (25mg  to 100mg ), Clonazepam.    Review of Systems:  Review of Systems  Musculoskeletal: Negative for gait problem.  Neurological: Negative for tremors.  Psychiatric/Behavioral:       Please refer to HPI    Medications: I have reviewed the patient's current medications.  Current Outpatient Medications  Medication Sig Dispense Refill  . ALPRAZolam (XANAX) 1 MG tablet Take one tablet up to 4 times daily as  needed for anxiety and panic attacks. 120 tablet 2  . buPROPion (WELLBUTRIN XL) 150 MG 24 hr tablet Take one tablet every morning for 7 days, then increase to two tablets every morning. 60 tablet 2  . escitalopram (LEXAPRO) 20 MG tablet Take 1 tablet (20 mg total) by mouth daily. 30 tablet 5  . hydrOXYzine (ATARAX/VISTARIL) 25 MG tablet Take 1 tablet (25 mg total) by mouth 2 (two) times daily. 60 tablet 5   No current facility-administered medications for this visit.    Medication Side Effects: None  Allergies:  Allergies  Allergen Reactions  . Codeine Nausea Only    Past Medical History:  Diagnosis Date  . Compartment syndrome of lower extremity (HCC) 2004  . Facial numbness   . GERD (gastroesophageal reflux disease)    with pregnancy  . Headache(784.0)   . Neck pain     Past Medical History, Surgical history, Social history, and Family history were reviewed and updated as appropriate.   Please see review of systems for further details on the patient's review from today.   Objective:   Physical Exam:  There were no vitals taken for this visit.  Physical Exam Constitutional:      General: She is not in acute distress. Musculoskeletal:        General: No deformity.  Neurological:     Mental Status: She is alert and oriented to person, place, and time.     Coordination: Coordination normal.  Psychiatric:        Attention and Perception: Attention and perception normal. She does not perceive auditory or visual hallucinations.  Mood and Affect: Mood normal. Mood is not anxious or depressed. Affect is not labile, blunt, angry or inappropriate.        Speech: Speech normal.        Behavior: Behavior normal.        Thought Content: Thought content normal. Thought content is not paranoid or delusional. Thought content does not include homicidal or suicidal ideation. Thought content does not include homicidal or suicidal plan.        Cognition and Memory: Cognition and  memory normal.        Judgment: Judgment normal.     Comments: Insight intact     Lab Review:  No results found for: NA, K, CL, CO2, GLUCOSE, BUN, CREATININE, CALCIUM, PROT, ALBUMIN, AST, ALT, ALKPHOS, BILITOT, GFRNONAA, GFRAA     Component Value Date/Time   WBC 6.6 11/18/2012 1245   RBC 3.87 11/18/2012 1245   HGB 12.7 11/18/2012 1245   HCT 38.6 11/18/2012 1245   PLT 359 11/18/2012 1245   MCV 99.7 11/18/2012 1245   MCH 32.8 11/18/2012 1245   MCHC 32.9 11/18/2012 1245   RDW 12.8 11/18/2012 1245    No results found for: POCLITH, LITHIUM   No results found for: PHENYTOIN, PHENOBARB, VALPROATE, CBMZ   .res Assessment: Plan:    Plan:  PDMP reviewed  1. Lexapro 20mg  daily 2. Xanax 1mg  4 times a day. 3. Hydroxyzine 25mg  at hs. 4. Wellbutrin XL 300mg  daily. Denies seizure history.  RTC 4 weeks  May return to work 10/28/2020 without restrictions.   Patient advised to contact office with any questions, adverse effects, or acute worsening in signs and symptoms.  Discussed potential benefits, risk, and side effects of benzodiazepines to include potential risk of tolerance and dependence, as well as possible drowsiness.  Advised patient not to drive if experiencing drowsiness and to take lowest possible effective dose to minimize risk of dependence and tolerance.   Diagnoses and all orders for this visit:  Major depressive disorder, recurrent episode, moderate (HCC)  Insomnia, unspecified type -     ALPRAZolam (XANAX) 1 MG tablet; Take one tablet up to 4 times daily as needed for anxiety and panic attacks.  Generalized anxiety disorder  Panic attacks     Please see After Visit Summary for patient specific instructions.  No future appointments.  No orders of the defined types were placed in this encounter.   -------------------------------

## 2020-11-11 ENCOUNTER — Telehealth: Payer: Self-pay | Admitting: Adult Health

## 2020-11-11 NOTE — Telephone Encounter (Signed)
Please review

## 2020-11-11 NOTE — Telephone Encounter (Signed)
Mary King called in regard to her Xanax 1mg  medication. She says it works great for her anxiety but isn't working well for panic attacks. 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 She would like to stay on the Xanax but would like to know is there something else she can try for panic attacks before her appt. 6/7. Pls RTC [phone]

## 2020-11-12 NOTE — Telephone Encounter (Signed)
Noted  

## 2020-11-12 NOTE — Telephone Encounter (Signed)
Pt stated she will discuss at appt

## 2020-11-12 NOTE — Telephone Encounter (Signed)
Xanax should help with panic attacks.

## 2020-12-03 ENCOUNTER — Encounter: Payer: Self-pay | Admitting: Adult Health

## 2020-12-03 ENCOUNTER — Other Ambulatory Visit: Payer: Self-pay

## 2020-12-03 ENCOUNTER — Ambulatory Visit (INDEPENDENT_AMBULATORY_CARE_PROVIDER_SITE_OTHER): Payer: BC Managed Care – PPO | Admitting: Adult Health

## 2020-12-03 ENCOUNTER — Telehealth: Payer: Self-pay | Admitting: Adult Health

## 2020-12-03 DIAGNOSIS — F411 Generalized anxiety disorder: Secondary | ICD-10-CM

## 2020-12-03 DIAGNOSIS — F331 Major depressive disorder, recurrent, moderate: Secondary | ICD-10-CM | POA: Diagnosis not present

## 2020-12-03 DIAGNOSIS — F410 Panic disorder [episodic paroxysmal anxiety] without agoraphobia: Secondary | ICD-10-CM

## 2020-12-03 DIAGNOSIS — G4700 Insomnia, unspecified: Secondary | ICD-10-CM | POA: Diagnosis not present

## 2020-12-03 MED ORDER — CLONAZEPAM 1 MG PO TABS: 1.0000 mg | ORAL_TABLET | Freq: Two times a day (BID) | ORAL | 0 refills | Status: DC

## 2020-12-03 NOTE — Progress Notes (Signed)
Mary King [id number] 05/17/1981 39 y.o.  Subjective:   Patient ID:  Mary King is a 40 y.o. (DOB [birth year]) female.  Chief Complaint: No chief complaint on file.   HPI Mary King presents to the office today for follow-up of MDD, GAD, insomnia, adjustment disorder and panic attacks.  Describes mood today as "stressed". Pleasant. Decreased tearfulness. Mood symptoms - reports depression, anxiety, and irritability. Moods are up and down. Having panic attacks. Recently picked up Xanax and husband took it  and filled the bottle up with water, so she has not had any since Nov 26, 2020. Feels like current medication are helpful, but is having increased depressive symptoms. Feeling overwhelmed. She and husband having marital issues. Has asked husband for a separation. Husband not wanting to leave. She is now staying with her parents until they can find another place to live. Had returned to work and after 2 weeks had a "relapse" in symptoms with the increased stressors and the loss of her medication. Taking medications as prescribed, and feels like they are helping. Improving interest and motivation.  Energy levels improved over past few days. Active, does not have a regular exercise routine.  Enjoys some usual interests and activities. Married, but separated. Lives with 3 children. Family local and supportive. Spending time with family. Appetite improved. Weight loss - 5 pounds - 145 from 150 pounds. Sleeps better some nights than others. Averages 6 hours. Focus and concentration difficulties - "I have so much on my mind right now". Completing tasks. Managing some aspects of household. 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  Denies AH or VH.   Previous medication trials: Zoloft 50mg  daily (25mg  to 100mg ), Clonazepam.   Review of Systems:  Review of Systems  Musculoskeletal: Negative for gait problem.  Neurological: Negative for tremors.   Psychiatric/Behavioral:       Please refer to HPI    Medications: I have reviewed the patient's current medications.  Current Outpatient Medications  Medication Sig Dispense Refill  . clonazePAM (KLONOPIN) 1 MG tablet Take 1 tablet (1 mg total) by mouth 2 (two) times daily. 60 tablet 0  . ALPRAZolam (XANAX) 1 MG tablet Take one tablet up to 4 times daily as needed for anxiety and panic attacks. 120 tablet 2  . buPROPion (WELLBUTRIN XL) 150 MG 24 hr tablet Take one tablet every morning for 7 days, then increase to two tablets every morning. 60 tablet 2  . escitalopram (LEXAPRO) 20 MG tablet Take 1 tablet (20 mg total) by mouth daily. 30 tablet 5  . hydrOXYzine (ATARAX/VISTARIL) 25 MG tablet Take 1 tablet (25 mg total) by mouth 2 (two) times daily. 60 tablet 5   No current facility-administered medications for this visit.    Medication Side Effects: None  Allergies:  Allergies  Allergen Reactions  . Codeine Nausea Only    Past Medical History:  Diagnosis Date  . Compartment syndrome of lower extremity (HCC) 2004  . Facial numbness   . GERD (gastroesophageal reflux disease)    with pregnancy  . Headache(784.0)   . Neck pain     Past Medical History, Surgical history, Social history, and Family history were reviewed and updated as appropriate.   Please see review of systems for further details on the patient's review from today.   Objective:   Physical Exam:  There were no vitals taken for this visit.  Physical Exam Constitutional:      General: She is  not in acute distress. Musculoskeletal:        General: No deformity.  Neurological:     Mental Status: She is alert and oriented to person, place, and time.     Coordination: Coordination normal.  Psychiatric:        Attention and Perception: Attention and perception normal. She does not perceive auditory or visual hallucinations.        Mood and Affect: Mood normal. Mood is not anxious or depressed. Affect is not  labile, blunt, angry or inappropriate.        Speech: Speech normal.        Behavior: Behavior normal.        Thought Content: Thought content normal. Thought content is not paranoid or delusional. Thought content does not include homicidal or suicidal ideation. Thought content does not include homicidal or suicidal plan.        Cognition and Memory: Cognition and memory normal.        Judgment: Judgment normal.     Comments: Insight intact     Lab Review:  No results found for: NA, K, CL, CO2, GLUCOSE, BUN, CREATININE, CALCIUM, PROT, ALBUMIN, AST, ALT, ALKPHOS, BILITOT, GFRNONAA, GFRAA     Component Value Date/Time   WBC 6.6 11/18/2012 1245   RBC 3.87 11/18/2012 1245   HGB 12.7 11/18/2012 1245   HCT 38.6 11/18/2012 1245   PLT 359 11/18/2012 1245   MCV 99.7 11/18/2012 1245   MCH 32.8 11/18/2012 1245   MCHC 32.9 11/18/2012 1245   RDW 12.8 11/18/2012 1245    No results found for: POCLITH, LITHIUM   No results found for: PHENYTOIN, PHENOBARB, VALPROATE, CBMZ   .res Assessment: Plan:    Plan:  PDMP reviewed  1. Lexapro 20mg  daily 2. Clonazepam 1mg  BID x 3 weeks and then switch back to Xanax. 3. Hydroxyzine 25mg  at hs. 4. Wellbutrin XL 300mg  daily. Denies seizure history.  RTC 4 weeks  Out of work 10/29/2020 through 11/22/2020 for a relapse in symptoms.  Will fill out intermittent FMLA.  Patient advised to contact office with any questions, adverse effects, or acute worsening in signs and symptoms.  Discussed potential benefits, risk, and side effects of benzodiazepines to include potential risk of tolerance and dependence, as well as possible drowsiness.  Advised patient not to drive if experiencing drowsiness and to take lowest possible effective dose to minimize risk of dependence and tolerance.   Diagnoses and all orders for this visit:  Panic attacks -     clonazePAM (KLONOPIN) 1 MG tablet; Take 1 tablet (1 mg total) by mouth 2 (two) times daily.  Major  depressive disorder, recurrent episode, moderate (HCC)  Insomnia, unspecified type  Generalized anxiety disorder     Please see After Visit Summary for patient specific instructions.  Future Appointments  Date Time Provider Department Center  01/07/2021  1:00 PM Zollie Ellery, , NP [facility] None    No orders of the defined types were placed in this encounter.   -------------------------------

## 2020-12-03 NOTE — Telephone Encounter (Signed)
Received LOA paper work. Gave to [employer]

## 2020-12-06 ENCOUNTER — Telehealth: Payer: Self-pay | Admitting: Adult Health

## 2020-12-06 DIAGNOSIS — Z0289 Encounter for other administrative examinations: Secondary | ICD-10-CM

## 2020-12-06 NOTE — Telephone Encounter (Signed)
Check with Traci.

## 2020-12-06 NOTE — Telephone Encounter (Signed)
Pt called and said that the paperwork needs to be faxed on mon 6/13 instead of 6/15

## 2020-12-06 NOTE — Telephone Encounter (Signed)
Needs to be faxed by Monday 6/13

## 2020-12-06 NOTE — Telephone Encounter (Signed)
Forms completed, will have Rene Kocher review and sign today

## 2020-12-06 NOTE — Telephone Encounter (Signed)
Noted - thanks for update 

## 2020-12-06 NOTE — Telephone Encounter (Signed)
Paperwork signed today and will be faxed this afternoon.

## 2020-12-06 NOTE — Telephone Encounter (Signed)
Please review.Not sure if yall know which paperwork but I will call her if I need to

## 2020-12-10 DIAGNOSIS — Z6825 Body mass index (BMI) 25.0-25.9, adult: Secondary | ICD-10-CM | POA: Diagnosis not present

## 2020-12-10 DIAGNOSIS — S129XXA Fracture of neck, unspecified, initial encounter: Secondary | ICD-10-CM | POA: Diagnosis not present

## 2020-12-12 DIAGNOSIS — S129XXA Fracture of neck, unspecified, initial encounter: Secondary | ICD-10-CM | POA: Diagnosis not present

## 2020-12-12 DIAGNOSIS — F1120 Opioid dependence, uncomplicated: Secondary | ICD-10-CM | POA: Diagnosis not present

## 2020-12-12 DIAGNOSIS — Z981 Arthrodesis status: Secondary | ICD-10-CM | POA: Diagnosis not present

## 2021-01-06 ENCOUNTER — Telehealth: Payer: Self-pay | Admitting: Adult Health

## 2021-01-06 NOTE — Telephone Encounter (Signed)
Received fax from [facility] regarding [employer]. They need completion of Mental Health Attending Physician's Statement. Placed on Traci's desk.

## 2021-01-07 ENCOUNTER — Encounter: Payer: Self-pay | Admitting: Adult Health

## 2021-01-07 ENCOUNTER — Other Ambulatory Visit: Payer: Self-pay

## 2021-01-07 ENCOUNTER — Ambulatory Visit: Payer: BC Managed Care – PPO | Admitting: Adult Health

## 2021-01-07 DIAGNOSIS — F4329 Adjustment disorder with other symptoms: Secondary | ICD-10-CM | POA: Diagnosis not present

## 2021-01-07 DIAGNOSIS — F331 Major depressive disorder, recurrent, moderate: Secondary | ICD-10-CM | POA: Diagnosis not present

## 2021-01-07 DIAGNOSIS — G4700 Insomnia, unspecified: Secondary | ICD-10-CM | POA: Diagnosis not present

## 2021-01-07 DIAGNOSIS — F411 Generalized anxiety disorder: Secondary | ICD-10-CM

## 2021-01-07 MED ORDER — HYDROXYZINE HCL 25 MG PO TABS: 25.0000 mg | ORAL_TABLET | Freq: Two times a day (BID) | ORAL | 5 refills | Status: DC

## 2021-01-07 MED ORDER — ESCITALOPRAM OXALATE 20 MG PO TABS: 20.0000 mg | ORAL_TABLET | Freq: Every day | ORAL | 5 refills | Status: DC

## 2021-01-07 MED ORDER — BUPROPION HCL ER (XL) 150 MG PO TB24: ORAL_TABLET | ORAL | 5 refills | Status: DC

## 2021-01-07 MED ORDER — ALPRAZOLAM 1 MG PO TABS: ORAL_TABLET | ORAL | 2 refills | Status: DC

## 2021-01-07 NOTE — Progress Notes (Signed)
Mary King [id number] 10/08/1980 40 y.o.  Subjective:   Patient ID:  Mary King is a 40 y.o. (DOB 14-Jun-1981) female.  Chief Complaint: No chief complaint on file.   HPI Mary King presents to the office today for follow-up of MDD, GAD, insomnia, adjustment disorder and panic attacks.  Describes mood today as "better". Pleasant. Decreased tearfulness. Mood symptoms - reports depression - improved, anxiety - "still there a lot", and irritability a times. Moods are leveling out. Continues to have panic attacks. She and husband working towards separation. He will keep the house and she is looking for a new home. Has returned to work and is doing better. Feels like current medication are working well.  Taking medications as prescribed, and feels like they are helping. Improving interest and motivation.  Energy levels improved. Active, does not have a regular exercise routine.  Enjoys some usual interests and activities. Married, but separated. Has 3 children - 14, 10, and 8. Family local and supportive. Spending time with family. Appetite improved. Weight loss - 145 pounds. Sleeps better some nights than others. Averages 6 to 7 hours. Focus and concentration difficulties - "getting better". Completing tasks. Managing some aspects of household. 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  Denies AH or VH.   Previous medication trials: Zoloft 50mg  daily (25mg  to 100mg ), Clonazepam.   Review of Systems:  Review of Systems  Musculoskeletal:  Negative for gait problem.  Neurological:  Negative for tremors.  Psychiatric/Behavioral:         Please refer to HPI   Medications: I have reviewed the patient's current medications.  Current Outpatient Medications  Medication Sig Dispense Refill   ALPRAZolam (XANAX) 1 MG tablet Take one tablet up to 4 times daily as needed for anxiety and panic attacks. 120 tablet 2   buPROPion (WELLBUTRIN XL) 150 MG  24 hr tablet Take one tablet every morning for 7 days, then increase to two tablets every morning. 60 tablet 5   escitalopram (LEXAPRO) 20 MG tablet Take 1 tablet (20 mg total) by mouth daily. 30 tablet 5   hydrOXYzine (ATARAX/VISTARIL) 25 MG tablet Take 1 tablet (25 mg total) by mouth 2 (two) times daily. 60 tablet 5   No current facility-administered medications for this visit.    Medication Side Effects: None  Allergies:  Allergies  Allergen Reactions   Codeine Nausea Only    Past Medical History:  Diagnosis Date   Compartment syndrome of lower extremity (HCC) 2004   Facial numbness    GERD (gastroesophageal reflux disease)    with pregnancy   Headache(784.0)    Neck pain     Past Medical History, Surgical history, Social history, and Family history were reviewed and updated as appropriate.   Please see review of systems for further details on the patient's review from today.   Objective:   Physical Exam:  There were no vitals taken for this visit.  Physical Exam Constitutional:      General: She is not in acute distress. Musculoskeletal:        General: No deformity.  Neurological:     Mental Status: She is alert and oriented to person, place, and time.     Coordination: Coordination normal.  Psychiatric:        Attention and Perception: Attention and perception normal. She does not perceive auditory or visual hallucinations.        Mood and Affect: Mood normal. Mood is not  anxious or depressed. Affect is not labile, blunt, angry or inappropriate.        Speech: Speech normal.        Behavior: Behavior normal.        Thought Content: Thought content normal. Thought content is not paranoid or delusional. Thought content does not include homicidal or suicidal ideation. Thought content does not include homicidal or suicidal plan.        Cognition and Memory: Cognition and memory normal.        Judgment: Judgment normal.     Comments: Insight intact    Lab Review:   No results found for: NA, K, CL, CO2, GLUCOSE, BUN, CREATININE, CALCIUM, PROT, ALBUMIN, AST, ALT, ALKPHOS, BILITOT, GFRNONAA, GFRAA     Component Value Date/Time   WBC 6.6 11/18/2012 1245   RBC 3.87 11/18/2012 1245   HGB 12.7 11/18/2012 1245   HCT 38.6 11/18/2012 1245   PLT 359 11/18/2012 1245   MCV 99.7 11/18/2012 1245   MCH 32.8 11/18/2012 1245   MCHC 32.9 11/18/2012 1245   RDW 12.8 11/18/2012 1245    No results found for: POCLITH, LITHIUM   No results found for: PHENYTOIN, PHENOBARB, VALPROATE, CBMZ   .res Assessment: Plan:    Plan:  PDMP reviewed  1. Lexapro 20mg  daily 2. Xanax 1 mg - 4 x daily 3. Hydroxyzine 25mg  at hs. 4. Wellbutrin XL 300mg  daily. Denies seizure history.  RTC 3 months   Patient advised to contact office with any questions, adverse effects, or acute worsening in signs and symptoms.  Discussed potential benefits, risk, and side effects of benzodiazepines to include potential risk of tolerance and dependence, as well as possible drowsiness.  Advised patient not to drive if experiencing drowsiness and to take lowest possible effective dose to minimize risk of dependence and tolerance.  Diagnoses and all orders for this visit:  Insomnia, unspecified type -     ALPRAZolam (XANAX) 1 MG tablet; Take one tablet up to 4 times daily as needed for anxiety and panic attacks. -     hydrOXYzine (ATARAX/VISTARIL) 25 MG tablet; Take 1 tablet (25 mg total) by mouth 2 (two) times daily.  Major depressive disorder, recurrent episode, moderate (HCC) -     escitalopram (LEXAPRO) 20 MG tablet; Take 1 tablet (20 mg total) by mouth daily. -     buPROPion (WELLBUTRIN XL) 150 MG 24 hr tablet; Take one tablet every morning for 7 days, then increase to two tablets every morning.  Generalized anxiety disorder -     escitalopram (LEXAPRO) 20 MG tablet; Take 1 tablet (20 mg total) by mouth daily.  Adjustment disorder with other symptom -     escitalopram (LEXAPRO) 20 MG  tablet; Take 1 tablet (20 mg total) by mouth daily.    Please see After Visit Summary for patient specific instructions.  Future Appointments  Date Time Provider Department Center  04/09/2021  4:20 PM Damari Suastegui, , NP [facility] None    No orders of the defined types were placed in this encounter.   -------------------------------

## 2021-01-08 ENCOUNTER — Telehealth: Payer: Self-pay | Admitting: Adult Health

## 2021-01-08 NOTE — Telephone Encounter (Signed)
Received fax from [facility] regarding [employer] for completion of Mental Health Attending Physician's Statement. Placed on Traci's desk.

## 2021-01-10 ENCOUNTER — Telehealth: Payer: Self-pay

## 2021-01-10 DIAGNOSIS — Z0289 Encounter for other administrative examinations: Secondary | ICD-10-CM

## 2021-01-10 NOTE — Telephone Encounter (Signed)
STD forms completed for [facility]. Given to [facility] for review and to sign

## 2021-01-13 ENCOUNTER — Telehealth: Payer: Self-pay | Admitting: Adult Health

## 2021-01-13 NOTE — Telephone Encounter (Signed)
STD form was signed and faxed to [facility] on 01/13/21

## 2021-01-30 ENCOUNTER — Telehealth: Payer: Self-pay | Admitting: Adult Health

## 2021-01-30 NOTE — Telephone Encounter (Signed)
Ok to approve if going out of town for work.

## 2021-01-30 NOTE — Telephone Encounter (Signed)
Pt called and asked if she can have an early refill on her xanax. She has a new job and she is being sent to 2 week training in [place]. She needs the refill by 8/17. The pharmacy told her that insurance will pay for it on the [address]. Pt is aware that she may have to pay out of pocket. . Please call pt at [phone]

## 2021-01-30 NOTE — Telephone Encounter (Signed)
Will call pharmacy for early fill on 8/16

## 2021-01-30 NOTE — Telephone Encounter (Signed)
Please review

## 2021-02-11 NOTE — Telephone Encounter (Signed)
Early refill approved and pt informed

## 2021-02-18 DIAGNOSIS — M5412 Radiculopathy, cervical region: Secondary | ICD-10-CM | POA: Diagnosis not present

## 2021-02-18 DIAGNOSIS — Z79899 Other long term (current) drug therapy: Secondary | ICD-10-CM | POA: Diagnosis not present

## 2021-02-18 DIAGNOSIS — F1120 Opioid dependence, uncomplicated: Secondary | ICD-10-CM | POA: Diagnosis not present

## 2021-02-18 DIAGNOSIS — R030 Elevated blood-pressure reading, without diagnosis of hypertension: Secondary | ICD-10-CM | POA: Diagnosis not present

## 2021-02-18 DIAGNOSIS — Z981 Arthrodesis status: Secondary | ICD-10-CM | POA: Diagnosis not present

## 2021-03-18 ENCOUNTER — Emergency Department (HOSPITAL_COMMUNITY)
Admission: EM | Admit: 2021-03-18 | Discharge: 2021-03-18 | Disposition: A | Discharge status: Home / Self Care | Payer: BC Managed Care – PPO | Attending: Emergency Medicine | Admitting: Emergency Medicine

## 2021-03-18 ENCOUNTER — Emergency Department (HOSPITAL_COMMUNITY): Payer: BC Managed Care – PPO

## 2021-03-18 ENCOUNTER — Encounter (HOSPITAL_COMMUNITY): Payer: Self-pay | Admitting: Emergency Medicine

## 2021-03-18 ENCOUNTER — Other Ambulatory Visit: Payer: Self-pay

## 2021-03-18 DIAGNOSIS — R9431 Abnormal electrocardiogram [ECG] [EKG]: Secondary | ICD-10-CM | POA: Diagnosis not present

## 2021-03-18 DIAGNOSIS — R1032 Left lower quadrant pain: Secondary | ICD-10-CM | POA: Diagnosis not present

## 2021-03-18 DIAGNOSIS — R109 Unspecified abdominal pain: Secondary | ICD-10-CM | POA: Insufficient documentation

## 2021-03-18 DIAGNOSIS — Z5321 Procedure and treatment not carried out due to patient leaving prior to being seen by health care provider: Secondary | ICD-10-CM | POA: Insufficient documentation

## 2021-03-18 DIAGNOSIS — R10A2 Flank pain, left side: Secondary | ICD-10-CM

## 2021-03-18 DIAGNOSIS — Z87448 Personal history of other diseases of urinary system: Secondary | ICD-10-CM | POA: Diagnosis not present

## 2021-03-18 DIAGNOSIS — N200 Calculus of kidney: Secondary | ICD-10-CM | POA: Diagnosis not present

## 2021-03-18 LAB — CBC WITH DIFFERENTIAL/PLATELET
Abs Immature Granulocytes: 0.04 10*3/uL (ref 0.00–0.07)
Basophils Absolute: 0 10*3/uL (ref 0.0–0.1)
Basophils Relative: 0 %
Eosinophils Absolute: 0.4 10*3/uL (ref 0.0–0.5)
Eosinophils Relative: 4 %
HCT: 35.2 % — ABNORMAL LOW (ref 36.0–46.0)
Hemoglobin: 11.3 g/dL — ABNORMAL LOW (ref 12.0–15.0)
Immature Granulocytes: 0 %
Lymphocytes Relative: 19 %
Lymphs Abs: 2.1 10*3/uL (ref 0.7–4.0)
MCH: 32.8 pg (ref 26.0–34.0)
MCHC: 32.1 g/dL (ref 30.0–36.0)
MCV: 102.3 fL — ABNORMAL HIGH (ref 80.0–100.0)
Monocytes Absolute: 0.6 10*3/uL (ref 0.1–1.0)
Monocytes Relative: 6 %
Neutro Abs: 7.6 10*3/uL (ref 1.7–7.7)
Neutrophils Relative %: 71 %
Platelets: 394 10*3/uL (ref 150–400)
RBC: 3.44 MIL/uL — ABNORMAL LOW (ref 3.87–5.11)
RDW: 14 % (ref 11.5–15.5)
WBC: 10.8 10*3/uL — ABNORMAL HIGH (ref 4.0–10.5)
nRBC: 0 % (ref 0.0–0.2)

## 2021-03-18 LAB — COMPREHENSIVE METABOLIC PANEL
ALT: 16 U/L (ref 0–44)
AST: 22 U/L (ref 15–41)
Albumin: 4.3 g/dL (ref 3.5–5.0)
Alkaline Phosphatase: 46 U/L (ref 38–126)
Anion gap: 7 (ref 5–15)
BUN: 11 mg/dL (ref 6–20)
CO2: 27 mmol/L (ref 22–32)
Calcium: 9.3 mg/dL (ref 8.9–10.3)
Chloride: 107 mmol/L (ref 98–111)
Creatinine, Ser: 0.61 mg/dL (ref 0.44–1.00)
GFR, Estimated: >60 mL/min (ref >60)
Glucose, Bld: 123 mg/dL — ABNORMAL HIGH (ref 70–99)
Potassium: 3.7 mmol/L (ref 3.5–5.1)
Sodium: 141 mmol/L (ref 135–145)
Total Bilirubin: 0.4 mg/dL (ref 0.3–1.2)
Total Protein: 7.3 g/dL (ref 6.5–8.1)

## 2021-03-18 LAB — URINALYSIS, COMPLETE (UACMP) WITH MICROSCOPIC
Bilirubin Urine: NEGATIVE
Glucose, UA: NEGATIVE mg/dL
Hgb urine dipstick: NEGATIVE
Ketones, ur: NEGATIVE mg/dL
Leukocytes,Ua: NEGATIVE
Nitrite: NEGATIVE
Protein, ur: NEGATIVE mg/dL
Specific Gravity, Urine: 1.015 (ref 1.005–1.030)
pH: 6 (ref 5.0–8.0)

## 2021-03-18 LAB — LIPASE, BLOOD: Lipase: 44 U/L (ref 11–51)

## 2021-03-18 MED ORDER — KETOROLAC TROMETHAMINE 30 MG/ML IJ SOLN: 30.0000 mg | Freq: Once | INTRAMUSCULAR | Status: DC

## 2021-03-18 NOTE — ED Provider Notes (Signed)
Emergency Medicine Provider Triage Evaluation Note  Mary King , a 40 y.o. female  was evaluated in triage.  Pt complains left flank pain since Saturday.  She reports that her anterior abdomen is now hurting also.  She reports a sharp pain through the flank.  She reports on Monday she had a very small amount of blood in her urine.  She has no history of kidney stones.  She reports that when the pain is bad she feels short of breath.   Review of Systems  Positive: Left flank pain Negative: fevers  Physical Exam  BP 122/72 (BP Location: Left Arm)   Pulse 90   Temp 98.3 F (36.8 C) (Oral)   Resp 16   SpO2 100%  Gen:   Awake, appears uncomfortable Resp:  Normal effort  MSK:   Moves extremities without difficulty  Other:  Speech is not slurred.   Medical Decision Making  Medically screening exam initiated at 3:37 PM.  Appropriate orders placed.  Mary King was informed that the remainder of the evaluation will be completed by another provider, this initial triage assessment does not replace that evaluation, and the importance of remaining in the ED until their evaluation is complete.     Cristina Gong, PA-C 03/18/21 1545    Melene Plan, DO 03/18/21 [phone]

## 2021-03-18 NOTE — ED Triage Notes (Signed)
Pt states that for 4 days she has had some redness in her urine, L side flank pain. Denies dysuria. Alert and oriented. Sent from [facility].

## 2021-03-18 NOTE — ED Provider Notes (Signed)
[facility]-EMERGENCY DEPT Provider Note   CSN: [id number] Arrival date & time: 03/18/21  1339     History Chief Complaint  Patient presents with   Flank Pain    Mary King is a 40 y.o. female.  HPI Patient reports she has severe pain in her left flank that wraps around to the front she indicates her lower chest wall.  Patient has pain with deep inspiration.  She reports she does feel short of breath.  No fevers and no cough.  No pain burning urgency with urination.  No blood in the urine.  Patient reports she has had a little bit of swelling in her legs.  No significant pain.  Patient reports last travel was a drive to [place] about a month ago.  Patient's mother had a DVT.  No personal history of DVT or PE.    Past Medical History:  Diagnosis Date   Compartment syndrome of lower extremity (HCC) 2004   Facial numbness    GERD (gastroesophageal reflux disease)    with pregnancy   Headache(784.0)    Neck pain     Patient Active Problem List   Diagnosis Date Noted   Adjustment disorder 08/15/2020   Anxiety 08/15/2020   Gastroesophageal reflux disease 08/15/2020   Migraine 08/15/2020   Sinusitis 08/15/2020   Oral thrush 08/15/2020   Pseudoarthrosis of cervical spine (HCC) 07/18/2020   Muscle spasm 04/04/2020   Migraine without aura and without status migrainosus, not intractable 08/30/2014   Paresthesia 08/30/2014   Neck pain on left side 08/30/2014   Anemia of mother in pregnancy, delivered 09/15/2012   Status post repeat low transverse cesarean section 09/13/2012   Postpartum care following cesarean delivery 09/13/2012    Past Surgical History:  Procedure Laterality Date   ANTERIOR COMPARTMENT DECOMPRESSION  2003   ARTHROSCOPIC REPAIR ACL  3785,8850   CESAREAN SECTION  2774,1287   CESAREAN SECTION N/A 09/13/2012   Procedure: CESAREAN SECTION;  Surgeon: Serita Kyle, MD;  Location: [facility];  Service: Obstetrics;  Laterality: N/A;   Repeat C/S  EDD: 09/20/12   DILATATION & CURRETTAGE/HYSTEROSCOPY WITH RESECTOCOPE N/A 11/18/2012   Procedure: DILATATION & CURETTAGE/HYSTEROSCOPY WITH RESECTOCOPE;  Surgeon: Serita Kyle, MD;  Location: [facility];  Service: Gynecology;  Laterality: N/A;   TONSILLECTOMY  2001     OB History     Gravida  5   Para  3   Term  3   Preterm  0   AB  2   Living  3      SAB  0   IAB  2   Ectopic  0   Multiple  0   Live Births  3           Family History  Problem Relation Age of Onset   Hypertension Mother    Hypothyroidism Father     Social History   Tobacco Use   Smoking status: Never   Smokeless tobacco: Never  Vaping Use   Vaping Use: Never used  Substance Use Topics   Alcohol use: Yes    Alcohol/week: 0.0 standard drinks    Comment: Social use   Drug use: No    Home Medications Prior to Admission medications   Medication Sig Start Date End Date Taking? Authorizing Provider  ALPRAZolam Prudy Feeler) 1 MG tablet Take one tablet up to 4 times daily as needed for anxiety and panic attacks. 01/07/21   Mozingo, Thereasa Solo, NP  buPROPion (WELLBUTRIN XL)  150 MG 24 hr tablet Take one tablet every morning for 7 days, then increase to two tablets every morning. 01/07/21   Mozingo, Thereasa Solo, NP  escitalopram (LEXAPRO) 20 MG tablet Take 1 tablet (20 mg total) by mouth daily. 01/07/21   Mozingo, Thereasa Solo, NP  hydrOXYzine (ATARAX/VISTARIL) 25 MG tablet Take 1 tablet (25 mg total) by mouth 2 (two) times daily. 01/07/21   Mozingo, Thereasa Solo, NP    Allergies    Codeine  Review of Systems   Review of Systems 10 Systems reviewed and negative except as per HPI Physical Exam Updated Vital Signs BP 124/72   Pulse (!) 107   Temp 99 F (37.2 C) (Oral)   Resp 16   Ht 5\' 5"  (1.651 m)   Wt 65.8 kg   LMP 03/03/2021   SpO2 100%   BMI 24.13 kg/m   Physical Exam Constitutional:      Appearance: Normal appearance.  HENT:     Mouth/Throat:      Pharynx: Oropharynx is clear.  Eyes:     Extraocular Movements: Extraocular movements intact.  Cardiovascular:     Rate and Rhythm: Normal rate and regular rhythm.  Pulmonary:     Effort: Pulmonary effort is normal.     Breath sounds: Normal breath sounds.  Chest:     Chest wall: Tenderness present.  Abdominal:     General: There is no distension.     Palpations: Abdomen is soft.     Tenderness: There is no abdominal tenderness. There is no guarding.  Musculoskeletal:        General: No signs of injury. Normal range of motion.     Comments: Patient has some mild edema bilateral feet.  Skin:    General: Skin is warm and dry.  Neurological:     General: No focal deficit present.     Mental Status: She is alert and oriented to person, place, and time.     Coordination: Coordination normal.  Psychiatric:        Mood and Affect: Mood normal.    ED Results / Procedures / Treatments   Labs (all labs ordered are listed, but only abnormal results are displayed) Labs Reviewed  COMPREHENSIVE METABOLIC PANEL - Abnormal; Notable for the following components:      Result Value   Glucose, Bld 123 (*)    All other components within normal limits  URINALYSIS, COMPLETE (UACMP) WITH MICROSCOPIC - Abnormal; Notable for the following components:   Bacteria, UA RARE (*)    All other components within normal limits  CBC WITH DIFFERENTIAL/PLATELET - Abnormal; Notable for the following components:   WBC 10.8 (*)    RBC 3.44 (*)    Hemoglobin 11.3 (*)    HCT 35.2 (*)    MCV 102.3 (*)    All other components within normal limits  LIPASE, BLOOD  D-DIMER, QUANTITATIVE    EKG EKG Interpretation  Date/Time:  Tuesday March 18 2021 15:45:37 EDT Ventricular Rate:  97 PR Interval:  156 QRS Duration: 88 QT Interval:  371 QTC Calculation: 472 R Axis:   35 Text Interpretation: Sinus rhythm Borderline T abnormalities, anterior leads otherwise normal tracing, no old comparison Confirmed by  [id number] [phone]) on 03/18/2021 11:03:18 PM  Radiology CT Renal Stone Study  Result Date: 03/18/2021 CLINICAL DATA:  Left flank pain. EXAM: CT ABDOMEN AND PELVIS WITHOUT CONTRAST TECHNIQUE: Multidetector CT imaging of the abdomen and pelvis was performed following the standard protocol without IV contrast.  COMPARISON:  None. FINDINGS: Lower chest: Mild atelectasis is seen within the bilateral lung bases. Hepatobiliary: No focal liver abnormality is seen. Heterogeneous sludge is suspected within the gallbladder lumen. No gallstones, gallbladder wall thickening, or biliary dilatation. Pancreas: Unremarkable. No pancreatic ductal dilatation or surrounding inflammatory changes. Spleen: Normal in size without focal abnormality. Adrenals/Urinary Tract: Adrenal glands are unremarkable. Kidneys are normal, without obstructing renal calculi, focal lesion, or hydronephrosis. Multiple 2 mm nonobstructing renal stones are seen within the right kidney. Bladder is unremarkable. Stomach/Bowel: Stomach is within normal limits. The appendix is poorly visualized. No evidence of bowel wall thickening, distention, or inflammatory changes. Vascular/Lymphatic: No significant vascular findings are present. No enlarged abdominal or pelvic lymph nodes. Reproductive: Uterus and bilateral adnexa are unremarkable. Other: No abdominal wall hernia or abnormality. A small amount of posterior pelvic free fluid is seen. Musculoskeletal: No acute or significant osseous findings. IMPRESSION: 1. Multiple 2 mm nonobstructing right renal calculi. 2. Small amount of posterior pelvic free fluid, likely physiologic. Electronically Signed   By: Aram Candela M.D.   On: 03/18/2021 16:25    Procedures Procedures   Medications Ordered in ED Medications  ketorolac (TORADOL) 30 MG/ML injection 30 mg (has no administration in time range)    ED Course  I have reviewed the triage vital signs and the nursing notes.  Pertinent labs &  imaging results that were available during my care of the patient were reviewed by me and considered in my medical decision making (see chart for details).    MDM Rules/Calculators/A&P                           Patient has flank pain with pleuritic component.  CT scan does not show any stones in the ureters.  She does have a couple stones in the right kidney but no obstructing stones.  Nothing on CT scan to explain patient's pain.  Will add D-dimer.  If positive patient will need PE study.  At this time will administer Toradol.  If work-up otherwise negative plan on treating for pleurisy and musculoskeletal pain.  Patient advised nursing staff that she was not waiting for further diagnostic evaluation and left before completing treatment for this encounter.  Patient did have very prolonged wait time due to recent very high volume in the ED.  I had reviewed with the patient the plan for D-dimer to try to rule out possibility of PE since patient does have severe, sharp pain in the left lower chest and flank area.  CT yesterday had ruled out for kidney stones.  At this time patient has left before completion of evaluation with risk for possible adverse outcome for incomplete evaluation.  Patient did not advise me of her plan to leave before completion of evaluation thus I could not review AMA risk with the patient. Final Clinical Impression(s) / ED Diagnoses Final diagnoses:  Left flank pain    Rx / DC Orders ED Discharge Orders     None        Arby Barrette, MD 03/22/21 1154

## 2021-03-18 NOTE — ED Notes (Signed)
Patient has a urine culture in main lab 

## 2021-03-19 ENCOUNTER — Emergency Department (HOSPITAL_COMMUNITY)
Admission: EM | Admit: 2021-03-19 | Discharge: 2021-03-19 | Disposition: A | Discharge status: Home / Self Care | Payer: BC Managed Care – PPO | Attending: Emergency Medicine | Admitting: Emergency Medicine

## 2021-03-19 ENCOUNTER — Other Ambulatory Visit: Payer: Self-pay

## 2021-03-19 ENCOUNTER — Emergency Department (HOSPITAL_COMMUNITY): Payer: BC Managed Care – PPO

## 2021-03-19 ENCOUNTER — Encounter (HOSPITAL_COMMUNITY): Payer: Self-pay

## 2021-03-19 DIAGNOSIS — R52 Pain, unspecified: Secondary | ICD-10-CM

## 2021-03-19 DIAGNOSIS — R1013 Epigastric pain: Secondary | ICD-10-CM | POA: Insufficient documentation

## 2021-03-19 DIAGNOSIS — K5900 Constipation, unspecified: Secondary | ICD-10-CM | POA: Diagnosis not present

## 2021-03-19 DIAGNOSIS — R1012 Left upper quadrant pain: Secondary | ICD-10-CM | POA: Insufficient documentation

## 2021-03-19 DIAGNOSIS — N200 Calculus of kidney: Secondary | ICD-10-CM | POA: Diagnosis not present

## 2021-03-19 DIAGNOSIS — R110 Nausea: Secondary | ICD-10-CM | POA: Insufficient documentation

## 2021-03-19 MED ORDER — SODIUM CHLORIDE 0.9 % IV SOLN
250.0000 mg | Freq: Every day | INTRAVENOUS | Status: DC
  Filled 2021-03-19 (×2): qty 4

## 2021-03-19 MED ORDER — SODIUM CHLORIDE 0.9 % IV SOLN
250.0000 mg | Freq: Every day | INTRAVENOUS | Status: DC
  Administered 2021-03-19: 250 mg via INTRAVENOUS
  Filled 2021-03-19: qty 250

## 2021-03-19 MED ORDER — ALUM & MAG HYDROXIDE-SIMETH 200-200-20 MG/5ML PO SUSP
30.0000 mL | Freq: Once | ORAL | Status: AC
  Administered 2021-03-19: 30 mL via ORAL
  Filled 2021-03-19: qty 30

## 2021-03-19 MED ORDER — IOHEXOL 350 MG/ML SOLN
100.0000 mL | Freq: Once | INTRAVENOUS | Status: AC | PRN
  Administered 2021-03-19: 100 mL via INTRAVENOUS

## 2021-03-19 MED ORDER — HYDROMORPHONE HCL 2 MG/ML IJ SOLN
1.5000 mg | Freq: Once | INTRAMUSCULAR | Status: AC
  Administered 2021-03-19: 1.5 mg via INTRAVENOUS
  Filled 2021-03-19: qty 1

## 2021-03-19 MED ORDER — PREDNISONE 10 MG PO TABS: 20.0000 mg | ORAL_TABLET | Freq: Every day | ORAL | 0 refills | Status: AC

## 2021-03-19 MED ORDER — LIDOCAINE VISCOUS HCL 2 % MT SOLN
15.0000 mL | Freq: Once | OROMUCOSAL | Status: AC
  Administered 2021-03-19: 15 mL via ORAL
  Filled 2021-03-19: qty 15

## 2021-03-19 MED ORDER — FAMOTIDINE 20 MG PO TABS: 20.0000 mg | ORAL_TABLET | Freq: Two times a day (BID) | ORAL | 2 refills | Status: DC

## 2021-03-19 MED ORDER — ONDANSETRON HCL 4 MG/2ML IJ SOLN
4.0000 mg | Freq: Once | INTRAMUSCULAR | Status: AC
  Administered 2021-03-19: 4 mg via INTRAVENOUS
  Filled 2021-03-19: qty 2

## 2021-03-19 NOTE — ED Triage Notes (Addendum)
Pt presents with c/o left flank pain. Pt reports she was here last night and diagnosed with a kidney stone but left because she said her nerves got the best of her. Pt is now back because the pain is worse.

## 2021-03-19 NOTE — ED Provider Notes (Signed)
[facility]-EMERGENCY DEPT Provider Note   CSN: [id number] Arrival date & time: 03/19/21  2536     History Chief Complaint  Patient presents with   Flank Pain    Mary King is a 40 y.o. female presenting to the ED with left upper quadrant and flank pain.  She reports sudden onset of symptoms 4 days ago in the morning, woke up with this.  Pain waxing and waning over past few days, does improve with ibuprofen.  +Nausea and some constipation the past few days.  She came to ED last night, had renal study (unremarkable) and labs in triage, left prior to completion of her workup due to long wait times.    Pain high intensity 9/10, located LUQ, worse with inspiration and movement, never had it before, feels sharp and stabbing.   HPI     Past Medical History:  Diagnosis Date   Compartment syndrome of lower extremity (HCC) 2004   Facial numbness    GERD (gastroesophageal reflux disease)    with pregnancy   Headache(784.0)    Neck pain     Patient Active Problem List   Diagnosis Date Noted   Adjustment disorder 08/15/2020   Anxiety 08/15/2020   Gastroesophageal reflux disease 08/15/2020   Migraine 08/15/2020   Sinusitis 08/15/2020   Oral thrush 08/15/2020   Pseudoarthrosis of cervical spine (HCC) 07/18/2020   Muscle spasm 04/04/2020   Migraine without aura and without status migrainosus, not intractable 08/30/2014   Paresthesia 08/30/2014   Neck pain on left side 08/30/2014   Anemia of mother in pregnancy, delivered 09/15/2012   Status post repeat low transverse cesarean section 09/13/2012   Postpartum care following cesarean delivery 09/13/2012    Past Surgical History:  Procedure Laterality Date   ANTERIOR COMPARTMENT DECOMPRESSION  2003   ARTHROSCOPIC REPAIR ACL  6440,3474   CESAREAN SECTION  2595,6387   CESAREAN SECTION N/A 09/13/2012   Procedure: CESAREAN SECTION;  Surgeon: Serita Kyle, MD;  Location: [facility];  Service: Obstetrics;   Laterality: N/A;  Repeat C/S  EDD: 09/20/12   DILATATION & CURRETTAGE/HYSTEROSCOPY WITH RESECTOCOPE N/A 11/18/2012   Procedure: DILATATION & CURETTAGE/HYSTEROSCOPY WITH RESECTOCOPE;  Surgeon: Serita Kyle, MD;  Location: [facility];  Service: Gynecology;  Laterality: N/A;   TONSILLECTOMY  2001     OB History     Gravida  5   Para  3   Term  3   Preterm  0   AB  2   Living  3      SAB  0   IAB  2   Ectopic  0   Multiple  0   Live Births  3           Family History  Problem Relation Age of Onset   Hypertension Mother    Hypothyroidism Father     Social History   Tobacco Use   Smoking status: Never   Smokeless tobacco: Never  Vaping Use   Vaping Use: Never used  Substance Use Topics   Alcohol use: Yes    Alcohol/week: 0.0 standard drinks    Comment: Social use   Drug use: No    Home Medications Prior to Admission medications   Medication Sig Start Date End Date Taking? Authorizing Provider  famotidine (PEPCID) 20 MG tablet Take 1 tablet (20 mg total) by mouth 2 (two) times daily for 30 doses. Take 30 minutes before breakfast and dinner 03/19/21 04/03/21 Yes Shelena Castelluccio, Kermit Balo,  MD  predniSONE (DELTASONE) 10 MG tablet Take 2 tablets (20 mg total) by mouth daily with breakfast for 4 days. 03/20/21 03/24/21 Yes Lean Fayson, Kermit Balo, MD  ALPRAZolam Prudy Feeler) 1 MG tablet Take one tablet up to 4 times daily as needed for anxiety and panic attacks. 01/07/21   Mozingo, Thereasa Solo, NP  buPROPion (WELLBUTRIN XL) 150 MG 24 hr tablet Take one tablet every morning for 7 days, then increase to two tablets every morning. 01/07/21   Mozingo, Thereasa Solo, NP  escitalopram (LEXAPRO) 20 MG tablet Take 1 tablet (20 mg total) by mouth daily. 01/07/21   Mozingo, Thereasa Solo, NP  hydrOXYzine (ATARAX/VISTARIL) 25 MG tablet Take 1 tablet (25 mg total) by mouth 2 (two) times daily. 01/07/21   Mozingo, Thereasa Solo, NP    Allergies    Codeine  Review of Systems   Review  of Systems  Constitutional:  Negative for chills and fever.  Eyes:  Negative for pain and visual disturbance.  Respiratory:  Negative for cough and shortness of breath.   Cardiovascular:  Negative for chest pain and palpitations.  Gastrointestinal:  Positive for abdominal pain, constipation and nausea. Negative for diarrhea and vomiting.  Genitourinary:  Negative for dysuria and hematuria.  Musculoskeletal:  Negative for arthralgias and back pain.  Skin:  Negative for color change and rash.  Neurological:  Negative for seizures and syncope.  All other systems reviewed and are negative.  Physical Exam Updated Vital Signs BP (!) 108/94   Pulse 74   Temp 98.5 F (36.9 C) (Oral)   Resp 18   LMP 03/03/2021 (Approximate)   SpO2 99%   Physical Exam Constitutional:      General: She is not in acute distress. HENT:     Head: Normocephalic and atraumatic.  Eyes:     Conjunctiva/sclera: Conjunctivae normal.     Pupils: Pupils are equal, round, and reactive to light.  Cardiovascular:     Rate and Rhythm: Normal rate and regular rhythm.  Pulmonary:     Effort: Pulmonary effort is normal. No respiratory distress.  Abdominal:     General: There is no distension.     Tenderness: There is abdominal tenderness in the epigastric area and left upper quadrant. There is no right CVA tenderness, left CVA tenderness, guarding or rebound. Negative signs include Murphy's sign and McBurney's sign.  Skin:    General: Skin is warm and dry.  Neurological:     General: No focal deficit present.     Mental Status: She is alert. Mental status is at baseline.  Psychiatric:        Mood and Affect: Mood normal.        Behavior: Behavior normal.    ED Results / Procedures / Treatments   Labs (all labs ordered are listed, but only abnormal results are displayed) Labs Reviewed - No data to display  EKG None  Radiology CT Renal Stone Study  Result Date: 03/18/2021 CLINICAL DATA:  Left flank pain.  EXAM: CT ABDOMEN AND PELVIS WITHOUT CONTRAST TECHNIQUE: Multidetector CT imaging of the abdomen and pelvis was performed following the standard protocol without IV contrast. COMPARISON:  None. FINDINGS: Lower chest: Mild atelectasis is seen within the bilateral lung bases. Hepatobiliary: No focal liver abnormality is seen. Heterogeneous sludge is suspected within the gallbladder lumen. No gallstones, gallbladder wall thickening, or biliary dilatation. Pancreas: Unremarkable. No pancreatic ductal dilatation or surrounding inflammatory changes. Spleen: Normal in size without focal abnormality. Adrenals/Urinary Tract: Adrenal glands are unremarkable.  Kidneys are normal, without obstructing renal calculi, focal lesion, or hydronephrosis. Multiple 2 mm nonobstructing renal stones are seen within the right kidney. Bladder is unremarkable. Stomach/Bowel: Stomach is within normal limits. The appendix is poorly visualized. No evidence of bowel wall thickening, distention, or inflammatory changes. Vascular/Lymphatic: No significant vascular findings are present. No enlarged abdominal or pelvic lymph nodes. Reproductive: Uterus and bilateral adnexa are unremarkable. Other: No abdominal wall hernia or abnormality. A small amount of posterior pelvic free fluid is seen. Musculoskeletal: No acute or significant osseous findings. IMPRESSION: 1. Multiple 2 mm nonobstructing right renal calculi. 2. Small amount of posterior pelvic free fluid, likely physiologic. Electronically Signed   By: Aram Candela M.D.   On: 03/18/2021 16:25   CT Angio Abd/Pel w/ and/or w/o  Result Date: 03/19/2021 CLINICAL DATA:  Left upper quadrant pain and nausea for the past 4 days. Evaluate for splenic or renal infarct. EXAM: CTA ABDOMEN AND PELVIS WITHOUT AND WITH CONTRAST TECHNIQUE: Multidetector CT imaging of the abdomen and pelvis was performed using the standard protocol during bolus administration of intravenous contrast. Multiplanar  reconstructed images and MIPs were obtained and reviewed to evaluate the vascular anatomy. CONTRAST:  OMNIPAQUE IOHEXOL 350 MG/ML SOLN COMPARISON:  CT scan of the abdomen and pelvis yesterday FINDINGS: VASCULAR Aorta: Normal caliber aorta without aneurysm, dissection, vasculitis or significant stenosis. Celiac: Patent without evidence of aneurysm, dissection, vasculitis or significant stenosis. The lateral segmental branch of the left hepatic artery is replaced to the left gastric artery. SMA: Patent without evidence of aneurysm, dissection, vasculitis or significant stenosis. Renals: Both renal arteries are patent without evidence of aneurysm, dissection, vasculitis, fibromuscular dysplasia or significant stenosis. Accessory left renal artery to the lower pole. IMA: Patent without evidence of aneurysm, dissection, vasculitis or significant stenosis. Inflow: Patent without evidence of aneurysm, dissection, vasculitis or significant stenosis. Proximal Outflow: Bilateral common femoral and visualized portions of the superficial and profunda femoral arteries are patent without evidence of aneurysm, dissection, vasculitis or significant stenosis. Veins: No obvious venous abnormality within the limitations of this arterial phase study. Review of the MIP images confirms the above findings. NON-VASCULAR Lower chest: No acute abnormality. Hepatobiliary: No focal liver abnormality is seen. No gallstones, gallbladder wall thickening, or biliary dilatation. Pancreas: Unremarkable. No pancreatic ductal dilatation or surrounding inflammatory changes. Spleen: Normal in size without focal abnormality. Normal parenchymal enhancement pattern. No findings suspicious for infarct. Adrenals/Urinary Tract: Adrenal glands are unremarkable. No evidence of hydronephrosis, abnormal renal parenchymal enhancement or enhancing renal mass. Punctate renal stone present in the lower pole of the right kidney. Bladder is unremarkable.  Stomach/Bowel: Stomach is within normal limits. Appendix appears normal. No evidence of bowel wall thickening, distention, or inflammatory changes. Lymphatic: No suspicious lymphadenopathy. Reproductive: Uterus and bilateral adnexa are unremarkable. Other: No abdominal wall hernia or abnormality. No abdominopelvic ascites. Musculoskeletal: No acute or significant osseous findings. IMPRESSION: VASCULAR 1. No evidence of aneurysm, atherosclerotic plaque, dissection or other acute arterial abnormality. 2. Lateral segmental branch of the left hepatic artery replaced to the left gastric artery. 3. Small accessory left renal artery to the lower pole. NON-VASCULAR 1. No acute abnormality within the abdomen or pelvis. Specifically, no findings suspicious for left splenic or renal infarct. 2. Punctate right lower pole nephrolithiasis again seen. Electronically Signed   By: Malachy Moan M.D.   On: 03/19/2021 15:08    Procedures Procedures   Medications Ordered in ED Medications  methylPREDNISolone sodium succinate (SOLU-MEDROL) 250 mg in sodium chloride 0.9 %  50 mL IVPB (250 mg Intravenous New Bag/Given 03/19/21 1645)  alum & mag hydroxide-simeth (MAALOX/MYLANTA) 200-200-20 MG/5ML suspension 30 mL (30 mLs Oral Given 03/19/21 1250)    And  lidocaine (XYLOCAINE) 2 % viscous mouth solution 15 mL (15 mLs Oral Given 03/19/21 1250)  iohexol (OMNIPAQUE) 350 MG/ML injection 100 mL (100 mLs Intravenous Contrast Given 03/19/21 1439)  HYDROmorphone (DILAUDID) injection 1.5 mg (1.5 mg Intravenous Given 03/19/21 1614)  ondansetron (ZOFRAN) injection 4 mg (4 mg Intravenous Given 03/19/21 1616)    ED Course  I have reviewed the triage vital signs and the nursing notes.  Pertinent labs & imaging results that were available during my care of the patient were reviewed by me and considered in my medical decision making (see chart for details).  Focal LUQ abdominal pain, nausea  Ddx includes gastritis vs peptic ulcer vs  splenic injury/infarct vs colitis vs other  Renal study yesterday in ED reviewed by myself, no focal abnormalities.  UA did not show clear evidence of infection/ UTI.  Clinically I doubt pyelo or sepsis.  WBC yesterday was normal, and vitals today are unremarkable.  Lower suspicion for PE, PNA with no respiratory symptoms, no acute risk factors.  Clinical Course as of 03/19/21 1652  Wed Mar 19, 2021  1506 LUQ pain, pending CT AP [MK]  1536 I reassessed the patient.  She reports that she does not temporary relief of her symptoms with a GI cocktail, but the pain is coming back.  I explained the differential at this point would include gastritis or peptic ulcer or other stomach issue versus referred pain from thoracic spine versus another cause it is not yet clear.  However with her normal vital signs, normal blood work yesterday, and reassuring CT scan, I do not see evidence of acute infarct or infection or other surgical emergency at this time.  Can try a course of steroids if her pain is coming from her back.  We also discussed some Pepcid for her stomach and dietary recommendations, including cutting back on the amount of coffee she is drinking.  She verbalized understanding.  She will follow-up with her PCP. [MT]    Clinical Course User Index [MK] Kommor, Wyn Forster, MD [MT] Renaye Rakers, Kermit Balo, MD    Final Clinical Impression(s) / ED Diagnoses Final diagnoses:  Epigastric pain    Rx / DC Orders ED Discharge Orders          Ordered    predniSONE (DELTASONE) 10 MG tablet  Daily with breakfast        03/19/21 1533    famotidine (PEPCID) 20 MG tablet  2 times daily        03/19/21 1533             Carlyon Nolasco, Kermit Balo, MD 03/19/21 1652

## 2021-03-19 NOTE — Discharge Instructions (Addendum)
We talked about your work-up in the ER today.  We are not always able to find a clear cause of patient's symptoms in the ER.  The CT scan of your abdomen and your blood test yesterday did not show any life-threatening cause of your pain.   It is possible that your pain is coming from your stomach, such as from an ulcer in your stomach, or gastritis.  Please read over the instructions about a bland diet to avoid inflaming your stomach.  You can also take Pepcid twice a day before meals, and can consider other over-the-counter medications like Tums or Maalox as needed.  We talked about the possibility that your pain is coming from your thoracic spine or mid back.  This can also cause pain which wraps around into your abdomen.  I prescribed some prednisone which may help with his back pain.  However you should follow-up with your primary care doctor for this issue.

## 2021-03-19 NOTE — ED Notes (Signed)
An After Visit Summary was printed and given to the patient. Discharge instructions given and no further questions at this time.  

## 2021-03-27 ENCOUNTER — Other Ambulatory Visit: Payer: Self-pay

## 2021-03-27 ENCOUNTER — Ambulatory Visit (INDEPENDENT_AMBULATORY_CARE_PROVIDER_SITE_OTHER): Payer: BC Managed Care – PPO | Admitting: Adult Health

## 2021-03-27 ENCOUNTER — Encounter: Payer: Self-pay | Admitting: Adult Health

## 2021-03-27 DIAGNOSIS — F411 Generalized anxiety disorder: Secondary | ICD-10-CM

## 2021-03-27 DIAGNOSIS — F4329 Adjustment disorder with other symptoms: Secondary | ICD-10-CM

## 2021-03-27 DIAGNOSIS — F410 Panic disorder [episodic paroxysmal anxiety] without agoraphobia: Secondary | ICD-10-CM

## 2021-03-27 DIAGNOSIS — F331 Major depressive disorder, recurrent, moderate: Secondary | ICD-10-CM

## 2021-03-27 DIAGNOSIS — G4700 Insomnia, unspecified: Secondary | ICD-10-CM

## 2021-03-27 MED ORDER — HYDROXYZINE HCL 25 MG PO TABS: 25.0000 mg | ORAL_TABLET | Freq: Two times a day (BID) | ORAL | 5 refills | Status: DC

## 2021-03-27 MED ORDER — BUPROPION HCL ER (XL) 150 MG PO TB24: ORAL_TABLET | ORAL | 5 refills | Status: DC

## 2021-03-27 MED ORDER — ESCITALOPRAM OXALATE 20 MG PO TABS: 20.0000 mg | ORAL_TABLET | Freq: Every day | ORAL | 5 refills | Status: DC

## 2021-03-27 MED ORDER — CLONAZEPAM 1 MG PO TABS: ORAL_TABLET | ORAL | 2 refills | Status: DC

## 2021-03-27 NOTE — Progress Notes (Signed)
Mary King [id number] 1980-12-26 40 y.o.  Subjective:   Patient ID:  Mary King is a 40 y.o. (DOB [birth year]) female.  Chief Complaint: No chief complaint on file.   HPI Mary King presents to the office today for follow-up of MDD, GAD, insomnia, adjustment disorder and panic attacks.  Describes mood today as "not the best". Pleasant. Decreased tearfulness. Mood symptoms - reports depression "can't get she and kids out of the house", increased anxiety - "it's difficult having him watch my every move" and irritability "at times". Moods remain level. Increased panic attacks - has been taking extra Xanax - "up to 5 a day" .  She and husband going through a divorce. Agreed to let him have the house. Was not able to qualify for a loan on her own and is now sharing a home with her husband. Husband has started tracking her phone and is "watching" her every move. Taking medications as prescribed, and feels like they are helping. Improving interest and motivation.  Energy levels improved. Active, does not have a regular exercise routine.  Enjoys some usual interests and activities. Married, but separated. Has 3 children - 14, 10, and 8. Family local and supportive. Spending time with family. Appetite improved. Weight loss - 139 from 145 pounds. Sleeps better some nights than others. Averages 6 to 7 hours. Focus and concentration "ok at work". Completing tasks. Managing some aspects of household. Works full-time. Denies SI or HI.   Denies AH or VH.  Previous medication trials: Zoloft 50mg  daily (25mg  to 100mg ), Clonazepam.    Flowsheet Row ED from 03/19/2021 in [facility] from 03/18/2021 in [facility]  C-SSRS RISK CATEGORY No Risk No Risk        Review of Systems:  Review of Systems  Musculoskeletal:  Negative for gait problem.  Neurological:  Negative for tremors.  Psychiatric/Behavioral:          Please refer to HPI   Medications: I have reviewed the patient's current medications.  Current Outpatient Medications  Medication Sig Dispense Refill   clonazePAM (KLONOPIN) 1 MG tablet Take one tablet up to four times daily as needed for anxiety. 120 tablet 2   buPROPion (WELLBUTRIN XL) 150 MG 24 hr tablet Take one tablet every morning for 7 days, then increase to two tablets every morning. 60 tablet 5   escitalopram (LEXAPRO) 20 MG tablet Take 1 tablet (20 mg total) by mouth daily. 30 tablet 5   famotidine (PEPCID) 20 MG tablet Take 1 tablet (20 mg total) by mouth 2 (two) times daily for 30 doses. Take 30 minutes before breakfast and dinner 30 tablet 2   hydrOXYzine (ATARAX/VISTARIL) 25 MG tablet Take 1 tablet (25 mg total) by mouth 2 (two) times daily. 60 tablet 5   No current facility-administered medications for this visit.    Medication Side Effects: None  Allergies:  Allergies  Allergen Reactions   Codeine Nausea Only    Tolerates other narcotics with out difficulty per her report    Past Medical History:  Diagnosis Date   Compartment syndrome of lower extremity (HCC) 2004   Facial numbness    GERD (gastroesophageal reflux disease)    with pregnancy   Headache(784.0)    Neck pain     Past Medical History, Surgical history, Social history, and Family history were reviewed and updated as appropriate.   Please see review of systems for further details on the patient's review from  today.   Objective:   Physical Exam:  LMP 03/03/2021 (Approximate)   Physical Exam Constitutional:      General: She is not in acute distress. Musculoskeletal:        General: No deformity.  Neurological:     Mental Status: She is alert and oriented to person, place, and time.     Coordination: Coordination normal.  Psychiatric:        Attention and Perception: Attention and perception normal. She does not perceive auditory or visual hallucinations.        Mood and Affect: Mood  normal. Mood is not anxious or depressed. Affect is not labile, blunt, angry or inappropriate.        Speech: Speech normal.        Behavior: Behavior normal.        Thought Content: Thought content normal. Thought content is not paranoid or delusional. Thought content does not include homicidal or suicidal ideation. Thought content does not include homicidal or suicidal plan.        Cognition and Memory: Cognition and memory normal.        Judgment: Judgment normal.     Comments: Insight intact    Lab Review:     Component Value Date/Time   NA 141 03/18/2021 1651   K 3.7 03/18/2021 1651   CL 107 03/18/2021 1651   CO2 27 03/18/2021 1651   GLUCOSE 123 (H) 03/18/2021 1651   BUN 11 03/18/2021 1651   CREATININE 0.61 03/18/2021 1651   CALCIUM 9.3 03/18/2021 1651   PROT 7.3 03/18/2021 1651   ALBUMIN 4.3 03/18/2021 1651   AST 22 03/18/2021 1651   ALT 16 03/18/2021 1651   ALKPHOS 46 03/18/2021 1651   BILITOT 0.4 03/18/2021 1651   GFRNONAA >60 03/18/2021 1651       Component Value Date/Time   WBC 10.8 (H) 03/18/2021 1651   RBC 3.44 (L) 03/18/2021 1651   HGB 11.3 (L) 03/18/2021 1651   HCT 35.2 (L) 03/18/2021 1651   PLT 394 03/18/2021 1651   MCV 102.3 (H) 03/18/2021 1651   MCH 32.8 03/18/2021 1651   MCHC 32.1 03/18/2021 1651   RDW 14.0 03/18/2021 1651   LYMPHSABS 2.1 03/18/2021 1651   MONOABS 0.6 03/18/2021 1651   EOSABS 0.4 03/18/2021 1651   BASOSABS 0.0 03/18/2021 1651    No results found for: POCLITH, LITHIUM   No results found for: PHENYTOIN, PHENOBARB, VALPROATE, CBMZ   .res Assessment: Plan:    Plan:  PDMP reviewed  1. Lexapro 20mg  daily 2. D/C Xanax 1 mg - 4 x daily - not lasting throughout the day 3. Hydroxyzine 25mg  at hs. 4. Wellbutrin XL 300mg  daily. Not taking every day, but plans to start. Denies seizure history. 5. Restart Clonazepam 1mg  four times daily   RTC 3 months   Patient advised to contact office with any questions, adverse effects, or acute  worsening in signs and symptoms.  Discussed potential benefits, risk, and side effects of benzodiazepines to include potential risk of tolerance and dependence, as well as possible drowsiness.  Advised patient not to drive if experiencing drowsiness and to take lowest possible effective dose to minimize risk of dependence and tolerance.   Diagnoses and all orders for this visit:  Panic attacks  Insomnia, unspecified type -     hydrOXYzine (ATARAX/VISTARIL) 25 MG tablet; Take 1 tablet (25 mg total) by mouth 2 (two) times daily.  Major depressive disorder, recurrent episode, moderate (HCC) -  escitalopram (LEXAPRO) 20 MG tablet; Take 1 tablet (20 mg total) by mouth daily. -     buPROPion (WELLBUTRIN XL) 150 MG 24 hr tablet; Take one tablet every morning for 7 days, then increase to two tablets every morning.  Generalized anxiety disorder -     escitalopram (LEXAPRO) 20 MG tablet; Take 1 tablet (20 mg total) by mouth daily. -     clonazePAM (KLONOPIN) 1 MG tablet; Take one tablet up to four times daily as needed for anxiety.  Adjustment disorder with other symptom -     escitalopram (LEXAPRO) 20 MG tablet; Take 1 tablet (20 mg total) by mouth daily.    Please see After Visit Summary for patient specific instructions.  Future Appointments  Date Time Provider Department Center  04/24/2021 11:40 AM Shashank Kwasnik, Thereasa Solo, NP [facility] None    No orders of the defined types were placed in this encounter.   -------------------------------

## 2021-04-07 ENCOUNTER — Telehealth: Payer: Self-pay | Admitting: Adult Health

## 2021-04-07 NOTE — Telephone Encounter (Signed)
Ok to switch back to xanax?Klonopin was filled on 9/29

## 2021-04-07 NOTE — Telephone Encounter (Signed)
Next visit is 04/24/21. Teryl said her Xanax was switched to Klonopin and now she wants to switch to back Xanax as it is helping her more. She also needs a refill on the Xanax. Pharmacy is:  [facility] [phone] - [place], [place] - [address] AT [facility] [address]  Phone:  [phone]  Fax:  [phone]

## 2021-04-08 ENCOUNTER — Other Ambulatory Visit: Payer: Self-pay

## 2021-04-08 MED ORDER — ALPRAZOLAM 1 MG PO TABS: 1.0000 mg | ORAL_TABLET | Freq: Four times a day (QID) | ORAL | 0 refills | Status: DC | PRN

## 2021-04-08 NOTE — Telephone Encounter (Signed)
Ok to switch back? 

## 2021-04-08 NOTE — Telephone Encounter (Signed)
Cancelled klonopin refills and informed pt to return any left to pharmacy.Pended xanax.

## 2021-04-09 ENCOUNTER — Ambulatory Visit: Payer: BC Managed Care – PPO | Admitting: Adult Health

## 2021-04-22 ENCOUNTER — Ambulatory Visit: Payer: BC Managed Care – PPO | Admitting: Adult Health

## 2021-04-22 DIAGNOSIS — M5412 Radiculopathy, cervical region: Secondary | ICD-10-CM | POA: Diagnosis not present

## 2021-04-22 DIAGNOSIS — F1120 Opioid dependence, uncomplicated: Secondary | ICD-10-CM | POA: Diagnosis not present

## 2021-04-22 DIAGNOSIS — M62838 Other muscle spasm: Secondary | ICD-10-CM | POA: Diagnosis not present

## 2021-04-22 NOTE — Progress Notes (Signed)
Patient no show appointment. ? ?

## 2021-04-24 ENCOUNTER — Ambulatory Visit: Payer: BC Managed Care – PPO | Admitting: Adult Health

## 2021-04-28 ENCOUNTER — Ambulatory Visit: Payer: BC Managed Care – PPO | Admitting: Adult Health

## 2021-04-30 ENCOUNTER — Encounter: Payer: Self-pay | Admitting: Adult Health

## 2021-04-30 ENCOUNTER — Other Ambulatory Visit: Payer: Self-pay

## 2021-04-30 ENCOUNTER — Ambulatory Visit: Payer: BC Managed Care – PPO | Admitting: Adult Health

## 2021-04-30 DIAGNOSIS — F331 Major depressive disorder, recurrent, moderate: Secondary | ICD-10-CM | POA: Diagnosis not present

## 2021-04-30 DIAGNOSIS — F410 Panic disorder [episodic paroxysmal anxiety] without agoraphobia: Secondary | ICD-10-CM

## 2021-04-30 DIAGNOSIS — G4700 Insomnia, unspecified: Secondary | ICD-10-CM | POA: Diagnosis not present

## 2021-04-30 DIAGNOSIS — F411 Generalized anxiety disorder: Secondary | ICD-10-CM

## 2021-04-30 DIAGNOSIS — F4329 Adjustment disorder with other symptoms: Secondary | ICD-10-CM | POA: Diagnosis not present

## 2021-04-30 MED ORDER — ALPRAZOLAM 1 MG PO TABS: 1.0000 mg | ORAL_TABLET | Freq: Four times a day (QID) | ORAL | 2 refills | Status: DC | PRN

## 2021-04-30 NOTE — Progress Notes (Signed)
Mary King [id number] Jul 16, 1980 40 y.o.  Subjective:   Patient ID:  Mary King is a 40 y.o. (DOB [birth year]) female.  Chief Complaint: No chief complaint on file.   HPI  Mary King presents to the office today for follow-up of MDD, GAD, insomnia, adjustment disorder and panic attacks.  Describes mood today as "better". Pleasant. Decreased tearfulness. Mood symptoms - reports decreased depression, anxiety, and irritability. Reports panic attacks. Mood fluctuates - situational. Stating "I'm doing better than I was". Feels "more calm and rational". She and husband planning to divorce - living together, but she is sleeping on the couch. Taking medications as prescribed, and feels like they are helping. Improving interest and motivation.  Energy levels stable. Active, does not have a regular exercise routine.  Enjoys some usual interests and activities. Married, but separated - living with her husband. Has 3 children - 14, 10, and 8. Family local and supportive. Spending time with family. Appetite improved. Weight stable - 142 pounds. Sleeps better some nights than others. Averages 6 to 7 hours. Focus and concentration "ok at work". Completing tasks. Managing some aspects of household. Works full-time.  Denies SI or HI.   Denies AH or VH.  Previous medication trials: Zoloft 50mg  daily (25mg  to 100mg ), Clonazepam.   Flowsheet Row ED from 03/19/2021 in [facility] from 03/18/2021 in [facility]  C-SSRS RISK CATEGORY No Risk No Risk        Review of Systems:  Review of Systems  Musculoskeletal:  Negative for gait problem.  Neurological:  Negative for tremors.  Psychiatric/Behavioral:         Please refer to HPI   Medications: I have reviewed the patient's current medications.  Current Outpatient Medications  Medication Sig Dispense Refill   ALPRAZolam (XANAX) 1 MG tablet Take 1 tablet (1 mg  total) by mouth 4 (four) times daily as needed for anxiety. 120 tablet 2   buPROPion (WELLBUTRIN XL) 150 MG 24 hr tablet Take one tablet every morning for 7 days, then increase to two tablets every morning. 60 tablet 5   escitalopram (LEXAPRO) 20 MG tablet Take 1 tablet (20 mg total) by mouth daily. 30 tablet 5   famotidine (PEPCID) 20 MG tablet Take 1 tablet (20 mg total) by mouth 2 (two) times daily for 30 doses. Take 30 minutes before breakfast and dinner 30 tablet 2   hydrOXYzine (ATARAX/VISTARIL) 25 MG tablet Take 1 tablet (25 mg total) by mouth 2 (two) times daily. 60 tablet 5   No current facility-administered medications for this visit.    Medication Side Effects: None  Allergies:  Allergies  Allergen Reactions   Codeine Nausea Only    Tolerates other narcotics with out difficulty per her report    Past Medical History:  Diagnosis Date   Compartment syndrome of lower extremity (HCC) 2004   Facial numbness    GERD (gastroesophageal reflux disease)    with pregnancy   Headache(784.0)    Neck pain     Past Medical History, Surgical history, Social history, and Family history were reviewed and updated as appropriate.   Please see review of systems for further details on the patient's review from today.   Objective:   Physical Exam:  There were no vitals taken for this visit.  Physical Exam Constitutional:      General: She is not in acute distress. Musculoskeletal:        General: No deformity.  Neurological:     Mental Status: She is alert and oriented to person, place, and time.     Coordination: Coordination normal.  Psychiatric:        Attention and Perception: Attention and perception normal. She does not perceive auditory or visual hallucinations.        Mood and Affect: Mood normal. Mood is not anxious or depressed. Affect is not labile, blunt, angry or inappropriate.        Speech: Speech normal.        Behavior: Behavior normal.        Thought Content:  Thought content normal. Thought content is not paranoid or delusional. Thought content does not include homicidal or suicidal ideation. Thought content does not include homicidal or suicidal plan.        Cognition and Memory: Cognition and memory normal.        Judgment: Judgment normal.     Comments: Insight intact    Lab Review:     Component Value Date/Time   NA 141 03/18/2021 1651   K 3.7 03/18/2021 1651   CL 107 03/18/2021 1651   CO2 27 03/18/2021 1651   GLUCOSE 123 (H) 03/18/2021 1651   BUN 11 03/18/2021 1651   CREATININE 0.61 03/18/2021 1651   CALCIUM 9.3 03/18/2021 1651   PROT 7.3 03/18/2021 1651   ALBUMIN 4.3 03/18/2021 1651   AST 22 03/18/2021 1651   ALT 16 03/18/2021 1651   ALKPHOS 46 03/18/2021 1651   BILITOT 0.4 03/18/2021 1651   GFRNONAA >60 03/18/2021 1651       Component Value Date/Time   WBC 10.8 (H) 03/18/2021 1651   RBC 3.44 (L) 03/18/2021 1651   HGB 11.3 (L) 03/18/2021 1651   HCT 35.2 (L) 03/18/2021 1651   PLT 394 03/18/2021 1651   MCV 102.3 (H) 03/18/2021 1651   MCH 32.8 03/18/2021 1651   MCHC 32.1 03/18/2021 1651   RDW 14.0 03/18/2021 1651   LYMPHSABS 2.1 03/18/2021 1651   MONOABS 0.6 03/18/2021 1651   EOSABS 0.4 03/18/2021 1651   BASOSABS 0.0 03/18/2021 1651    No results found for: POCLITH, LITHIUM   No results found for: PHENYTOIN, PHENOBARB, VALPROATE, CBMZ   .res Assessment: Plan:     Plan:  PDMP reviewed  1. Lexapro 20mg  daily 2. Xanax 1 mg - 4 x daily  3. Hydroxyzine 25mg  at hs. 4. Wellbutrin XL 300mg  daily. Not taking every day, but plans to start. Denies seizure history.  RTC 3 months   Patient advised to contact office with any questions, adverse effects, or acute worsening in signs and symptoms.  Discussed potential benefits, risk, and side effects of benzodiazepines to include potential risk of tolerance and dependence, as well as possible drowsiness.  Advised patient not to drive if experiencing drowsiness and to take  lowest possible effective dose to minimize risk of dependence and tolerance.   Diagnoses and all orders for this visit:  Insomnia, unspecified type  Major depressive disorder, recurrent episode, moderate (HCC)  Panic attacks -     ALPRAZolam (XANAX) 1 MG tablet; Take 1 tablet (1 mg total) by mouth 4 (four) times daily as needed for anxiety.  Adjustment disorder with other symptom  Generalized anxiety disorder -     ALPRAZolam (XANAX) 1 MG tablet; Take 1 tablet (1 mg total) by mouth 4 (four) times daily as needed for anxiety.    Please see After Visit Summary for patient specific instructions.  No future appointments.  No orders  of the defined types were placed in this encounter.   -------------------------------

## 2021-05-01 ENCOUNTER — Telehealth: Payer: Self-pay | Admitting: Adult Health

## 2021-05-01 NOTE — Telephone Encounter (Signed)
Last filled 10/11 due 11/8.Do you approve to refill this early?

## 2021-05-01 NOTE — Telephone Encounter (Signed)
Keora called because she is going out of town tomorrow and needs to pick up her Xanax today.  It is early by a couple of days so she is asking for you to call the pharmacy to approve early refill so she can pick it up today.  [facility] [address], [place]

## 2021-05-01 NOTE — Telephone Encounter (Signed)
We discussed this yesterday - ok.

## 2021-05-01 NOTE — Telephone Encounter (Signed)
Refill approved.Pharmacy stated to let you know this is her 3rd time and they will no longer be able to fill it early after this.

## 2021-05-01 NOTE — Telephone Encounter (Signed)
Ok noted  

## 2021-07-04 DIAGNOSIS — Z01818 Encounter for other preprocedural examination: Secondary | ICD-10-CM | POA: Diagnosis not present

## 2021-07-04 DIAGNOSIS — S129XXA Fracture of neck, unspecified, initial encounter: Secondary | ICD-10-CM | POA: Diagnosis not present

## 2021-07-09 DIAGNOSIS — S129XXA Fracture of neck, unspecified, initial encounter: Secondary | ICD-10-CM | POA: Diagnosis not present

## 2021-07-09 DIAGNOSIS — M47812 Spondylosis without myelopathy or radiculopathy, cervical region: Secondary | ICD-10-CM | POA: Diagnosis not present

## 2021-07-09 DIAGNOSIS — M960 Pseudarthrosis after fusion or arthrodesis: Secondary | ICD-10-CM | POA: Diagnosis not present

## 2021-07-22 DIAGNOSIS — S129XXA Fracture of neck, unspecified, initial encounter: Secondary | ICD-10-CM | POA: Diagnosis not present

## 2021-07-24 ENCOUNTER — Other Ambulatory Visit: Payer: Self-pay

## 2021-07-24 DIAGNOSIS — F410 Panic disorder [episodic paroxysmal anxiety] without agoraphobia: Secondary | ICD-10-CM

## 2021-07-24 DIAGNOSIS — F411 Generalized anxiety disorder: Secondary | ICD-10-CM

## 2021-07-24 MED ORDER — ALPRAZOLAM 1 MG PO TABS: 1.0000 mg | ORAL_TABLET | Freq: Four times a day (QID) | ORAL | 2 refills | Status: DC | PRN

## 2021-07-31 ENCOUNTER — Ambulatory Visit: Payer: BC Managed Care – PPO | Admitting: Adult Health

## 2021-08-06 ENCOUNTER — Encounter: Payer: Self-pay | Admitting: Adult Health

## 2021-08-06 ENCOUNTER — Other Ambulatory Visit: Payer: Self-pay

## 2021-08-06 ENCOUNTER — Ambulatory Visit: Payer: BC Managed Care – PPO | Admitting: Adult Health

## 2021-08-06 DIAGNOSIS — F411 Generalized anxiety disorder: Secondary | ICD-10-CM

## 2021-08-06 DIAGNOSIS — F4329 Adjustment disorder with other symptoms: Secondary | ICD-10-CM

## 2021-08-06 DIAGNOSIS — F331 Major depressive disorder, recurrent, moderate: Secondary | ICD-10-CM

## 2021-08-06 DIAGNOSIS — G4700 Insomnia, unspecified: Secondary | ICD-10-CM | POA: Diagnosis not present

## 2021-08-06 DIAGNOSIS — F410 Panic disorder [episodic paroxysmal anxiety] without agoraphobia: Secondary | ICD-10-CM

## 2021-08-06 MED ORDER — ESCITALOPRAM OXALATE 20 MG PO TABS: 20.0000 mg | ORAL_TABLET | Freq: Every day | ORAL | 5 refills | Status: DC

## 2021-08-06 MED ORDER — ALPRAZOLAM 1 MG PO TABS: 1.0000 mg | ORAL_TABLET | Freq: Four times a day (QID) | ORAL | 2 refills | Status: AC | PRN

## 2021-08-06 MED ORDER — HYDROXYZINE HCL 25 MG PO TABS: 25.0000 mg | ORAL_TABLET | Freq: Two times a day (BID) | ORAL | 5 refills | Status: DC

## 2021-08-06 MED ORDER — BUPROPION HCL ER (XL) 150 MG PO TB24: ORAL_TABLET | ORAL | 5 refills | Status: DC

## 2021-08-06 NOTE — Progress Notes (Signed)
Mary King [id number] 01-16-1981 40 y.o.  Subjective:   Patient ID:  Mary King is a 41 y.o. (DOB [birth year]) female.  Chief Complaint: No chief complaint on file.   HPI Vi L Masser presents to the office today for follow-up of MDD, GAD, insomnia, adjustment disorder and panic attacks.  Describes mood today as "ok". Pleasant. Decreased tearfulness. Mood symptoms - reports decreased depression, anxiety, and irritability. Reports panic attacks. Mood fluctuates - "depends on the situation". Stating "I feel like I'm in a better place than I have been". She and husband planning to divorce - have separate finances - continues to live together for now. Improved interest and motivation. Taking medications as prescribed. Energy levels stable. Active, does not have a regular exercise routine.  Enjoys some usual interests and activities. Married, but separated - living with her husband. Has 3 children - 14, 10, and 8. Family local and supportive. Spending time with family. Appetite improved. Weight stable - 142 pounds. Sleeps better some nights than others. Averages 6 to 7 hours. Focus and concentration stable. Completing tasks. Managing some aspects of household. Works full-time - Scientist, research (medical). Denies SI or HI.   Denies AH or VH.  Previous medication trials: Zoloft 50mg  daily (25mg  to 100mg ), Clonazepam.    Flowsheet Row ED from 03/19/2021 in [facility] from 03/18/2021 in [facility]  C-SSRS RISK CATEGORY No Risk No Risk        Review of Systems:  Review of Systems  Musculoskeletal:  Negative for gait problem.  Neurological:  Negative for tremors.  Psychiatric/Behavioral:         Please refer to HPI   Medications: I have reviewed the patient's current medications.  Current Outpatient Medications  Medication Sig Dispense Refill   ALPRAZolam (XANAX) 1 MG tablet Take 1 tablet (1 mg total)  by mouth 4 (four) times daily as needed for anxiety. 120 tablet 2   buPROPion (WELLBUTRIN XL) 150 MG 24 hr tablet Take one tablet every morning for 7 days, then increase to two tablets every morning. 60 tablet 5   escitalopram (LEXAPRO) 20 MG tablet Take 1 tablet (20 mg total) by mouth daily. 30 tablet 5   famotidine (PEPCID) 20 MG tablet Take 1 tablet (20 mg total) by mouth 2 (two) times daily for 30 doses. Take 30 minutes before breakfast and dinner 30 tablet 2   hydrOXYzine (ATARAX) 25 MG tablet Take 1 tablet (25 mg total) by mouth 2 (two) times daily. 60 tablet 5   No current facility-administered medications for this visit.    Medication Side Effects: None  Allergies:  Allergies  Allergen Reactions   Codeine Nausea Only    Tolerates other narcotics with out difficulty per her report    Past Medical History:  Diagnosis Date   Compartment syndrome of lower extremity (HCC) 2004   Facial numbness    GERD (gastroesophageal reflux disease)    with pregnancy   Headache(784.0)    Neck pain     Past Medical History, Surgical history, Social history, and Family history were reviewed and updated as appropriate.   Please see review of systems for further details on the patient's review from today.   Objective:   Physical Exam:  There were no vitals taken for this visit.  Physical Exam Constitutional:      General: She is not in acute distress. Musculoskeletal:        General: No deformity.  Neurological:  Mental Status: She is alert and oriented to person, place, and time.     Coordination: Coordination normal.  Psychiatric:        Attention and Perception: Attention and perception normal. She does not perceive auditory or visual hallucinations.        Mood and Affect: Mood normal. Mood is not anxious or depressed. Affect is not labile, blunt, angry or inappropriate.        Speech: Speech normal.        Behavior: Behavior normal.        Thought Content: Thought content  normal. Thought content is not paranoid or delusional. Thought content does not include homicidal or suicidal ideation. Thought content does not include homicidal or suicidal plan.        Cognition and Memory: Cognition and memory normal.        Judgment: Judgment normal.     Comments: Insight intact    Lab Review:     Component Value Date/Time   NA 141 03/18/2021 1651   K 3.7 03/18/2021 1651   CL 107 03/18/2021 1651   CO2 27 03/18/2021 1651   GLUCOSE 123 (H) 03/18/2021 1651   BUN 11 03/18/2021 1651   CREATININE 0.61 03/18/2021 1651   CALCIUM 9.3 03/18/2021 1651   PROT 7.3 03/18/2021 1651   ALBUMIN 4.3 03/18/2021 1651   AST 22 03/18/2021 1651   ALT 16 03/18/2021 1651   ALKPHOS 46 03/18/2021 1651   BILITOT 0.4 03/18/2021 1651   GFRNONAA >60 03/18/2021 1651       Component Value Date/Time   WBC 10.8 (H) 03/18/2021 1651   RBC 3.44 (L) 03/18/2021 1651   HGB 11.3 (L) 03/18/2021 1651   HCT 35.2 (L) 03/18/2021 1651   PLT 394 03/18/2021 1651   MCV 102.3 (H) 03/18/2021 1651   MCH 32.8 03/18/2021 1651   MCHC 32.1 03/18/2021 1651   RDW 14.0 03/18/2021 1651   LYMPHSABS 2.1 03/18/2021 1651   MONOABS 0.6 03/18/2021 1651   EOSABS 0.4 03/18/2021 1651   BASOSABS 0.0 03/18/2021 1651    No results found for: POCLITH, LITHIUM   No results found for: PHENYTOIN, PHENOBARB, VALPROATE, CBMZ   .res Assessment: Plan:    Plan:  PDMP reviewed  1. Lexapro 20mg  daily 2. Xanax 1 mg - 4 x daily  3. Hydroxyzine 25mg  at hs. 4. Wellbutrin XL 300mg  daily. Not taking every day, but plans to start. Denies seizure history.  RTC 3 months   Patient advised to contact office with any questions, adverse effects, or acute worsening in signs and symptoms.  Discussed potential benefits, risk, and side effects of benzodiazepines to include potential risk of tolerance and dependence, as well as possible drowsiness.  Advised patient not to drive if experiencing drowsiness and to take lowest possible  effective dose to minimize risk of dependence and tolerance. Diagnoses and all orders for this visit:  Major depressive disorder, recurrent episode, moderate (HCC) -     buPROPion (WELLBUTRIN XL) 150 MG 24 hr tablet; Take one tablet every morning for 7 days, then increase to two tablets every morning. -     escitalopram (LEXAPRO) 20 MG tablet; Take 1 tablet (20 mg total) by mouth daily.  Generalized anxiety disorder -     escitalopram (LEXAPRO) 20 MG tablet; Take 1 tablet (20 mg total) by mouth daily. -     ALPRAZolam (XANAX) 1 MG tablet; Take 1 tablet (1 mg total) by mouth 4 (four) times daily as needed for anxiety.  Adjustment disorder with other symptom -     escitalopram (LEXAPRO) 20 MG tablet; Take 1 tablet (20 mg total) by mouth daily.  Insomnia, unspecified type -     hydrOXYzine (ATARAX) 25 MG tablet; Take 1 tablet (25 mg total) by mouth 2 (two) times daily.  Panic attacks -     ALPRAZolam (XANAX) 1 MG tablet; Take 1 tablet (1 mg total) by mouth 4 (four) times daily as needed for anxiety.     Please see After Visit Summary for patient specific instructions.  No future appointments.  No orders of the defined types were placed in this encounter.   -------------------------------

## 2021-08-29 ENCOUNTER — Other Ambulatory Visit: Payer: Self-pay | Admitting: Neurological Surgery

## 2021-08-29 DIAGNOSIS — S129XXS Fracture of neck, unspecified, sequela: Secondary | ICD-10-CM

## 2021-09-01 ENCOUNTER — Telehealth: Payer: Self-pay | Admitting: Adult Health

## 2021-09-01 NOTE — Telephone Encounter (Signed)
Is she working with a therapist regularly? If, not let's get her set up - she needs help managing the stressors.

## 2021-09-01 NOTE — Telephone Encounter (Signed)
See message. I called patient and she reiterated the same. She said that there are martial issues, financial issues, and her 8-YO has started seeing a psychiatrist. She tries to get up before the kids to have some time to herself and mediate, practices breathing exercises when she has attacks.  ?

## 2021-09-01 NOTE — Telephone Encounter (Signed)
No current availability for [facility] therapists. Recommended [facility] and [facility].  ?

## 2021-09-01 NOTE — Telephone Encounter (Signed)
Pt called and said that three times a week she has a severe panic attack to the point she blacks out and falls on the floor. She would like something stronger than the xanax to take since that doesn't help at all when these attacks occur. Please give her a call at [phone] ?

## 2021-09-01 NOTE — Telephone Encounter (Signed)
I don't know who is seeing patients here as of now - we can refer her as well. She has a lot of situational stressors.

## 2021-09-01 NOTE — Telephone Encounter (Signed)
I haven't called patient yet. Do we have an available therapist here to see her or should I refer her to [facility] or [facility]?  ?

## 2021-09-19 DIAGNOSIS — M62838 Other muscle spasm: Secondary | ICD-10-CM | POA: Diagnosis not present

## 2021-09-19 DIAGNOSIS — F1120 Opioid dependence, uncomplicated: Secondary | ICD-10-CM | POA: Diagnosis not present

## 2021-09-19 DIAGNOSIS — S129XXS Fracture of neck, unspecified, sequela: Secondary | ICD-10-CM | POA: Diagnosis not present

## 2021-09-29 ENCOUNTER — Other Ambulatory Visit: Payer: BC Managed Care – PPO

## 2021-10-01 IMAGING — CT CT CTA ABD/PEL W/CM AND/OR W/O CM
2 of 6 series · 15 of 46 positions shown, 17 images · IV contrast (OMNIPAQUE)
Comparison: CT scan of the abdomen and pelvis yesterday

CLINICAL DATA: Left upper quadrant pain and nausea for the past 4
days. Evaluate for splenic or renal infarct.

EXAM:
CTA ABDOMEN AND PELVIS WITHOUT AND WITH CONTRAST
TECHNIQUE: Multidetector CT imaging of the abdomen and pelvis was performed
using the standard protocol during bolus administration of
intravenous contrast. Multiplanar reconstructed images and MIPs were
obtained and reviewed to evaluate the vascular anatomy.
CONTRAST:  100mL OMNIPAQUE IOHEXOL 350 MG/ML SOLN

[Series 5: axial arterial · axial · arterial · 0.69mm/px · z∈[-458,-20]mm · 12 of 160 slices shown, 14 images]
[im 7/160  soft-tissue]
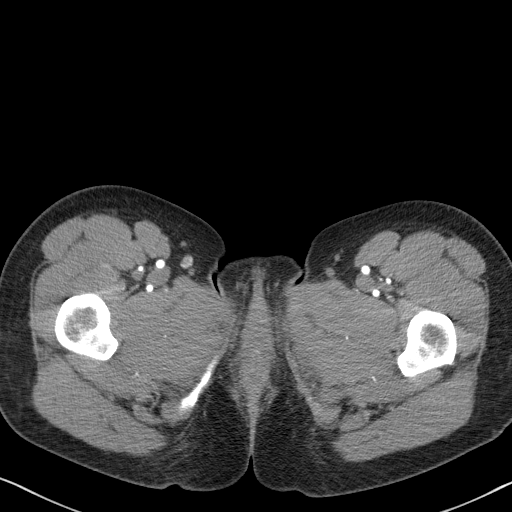
[im 7/160  bone]
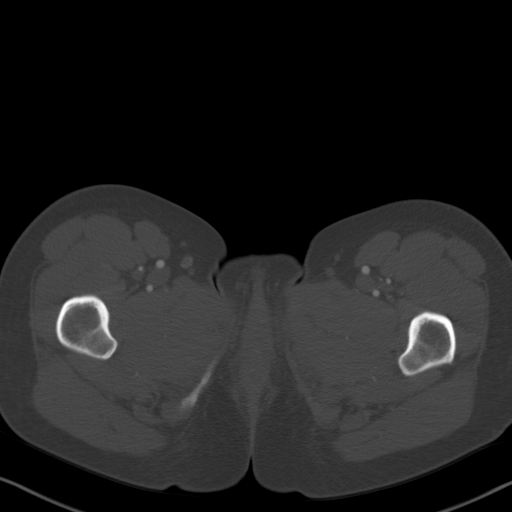
[im 21/160  soft-tissue]
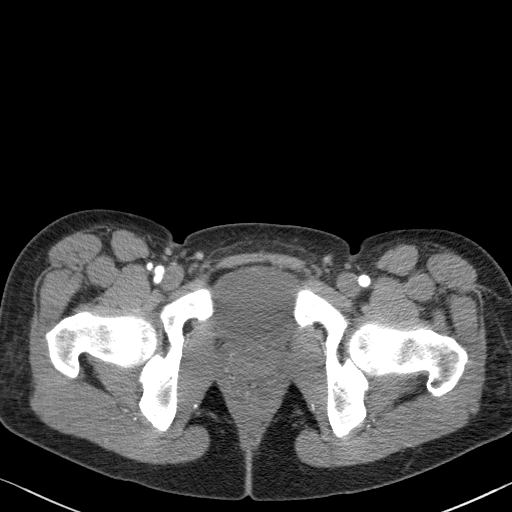
[im 35/160  soft-tissue]
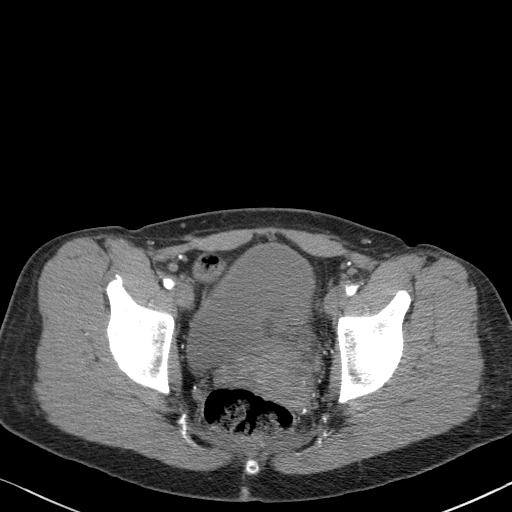
[im 49/160  soft-tissue]
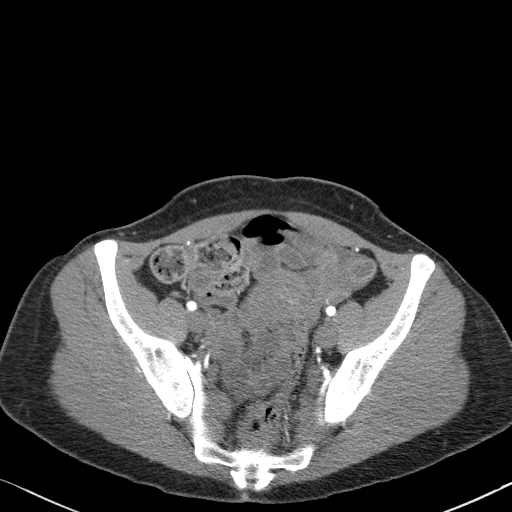
[im 63/160  soft-tissue]
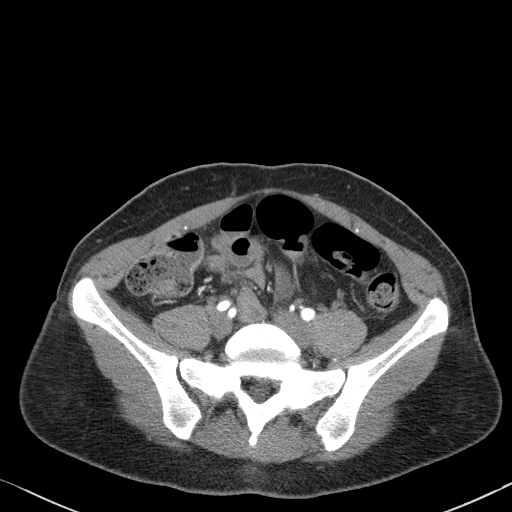
[im 77/160  soft-tissue]
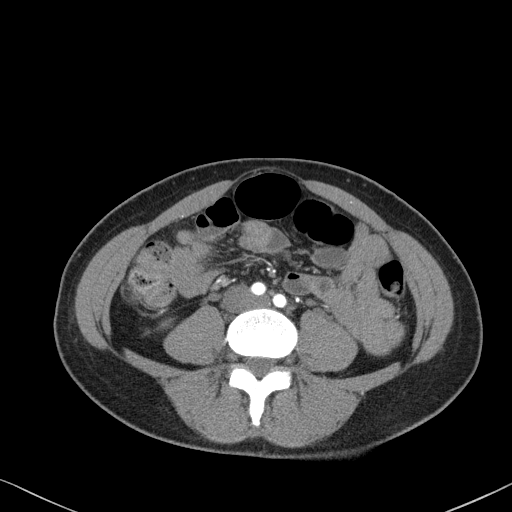
[im 83/160  soft-tissue]
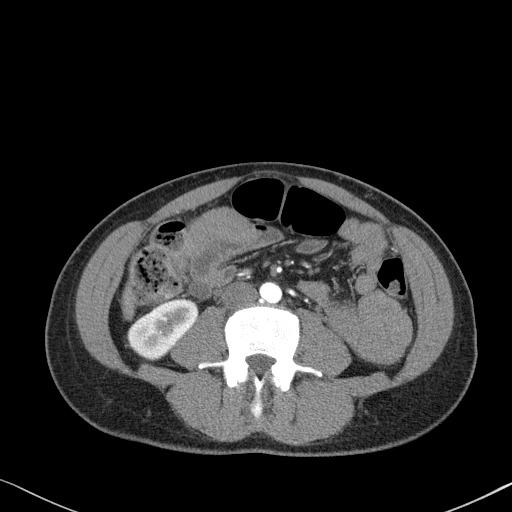
[im 97/160  soft-tissue]
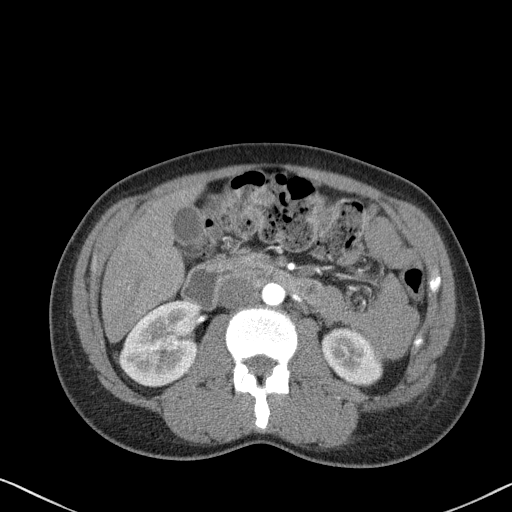
[im 111/160  soft-tissue]
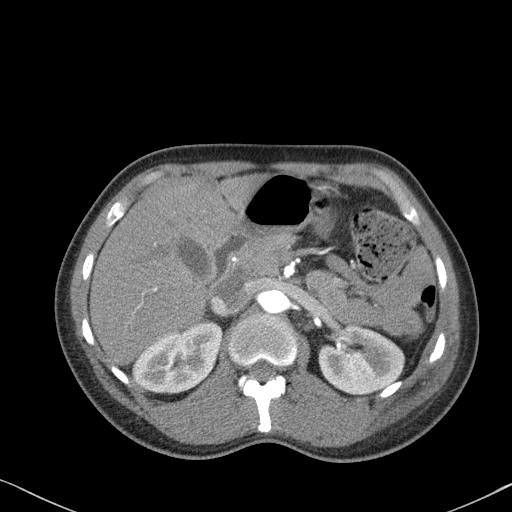
[im 111/160  bone]
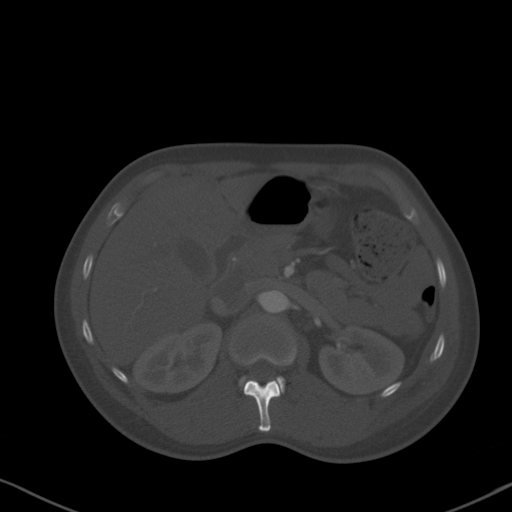
[im 125/160  soft-tissue]
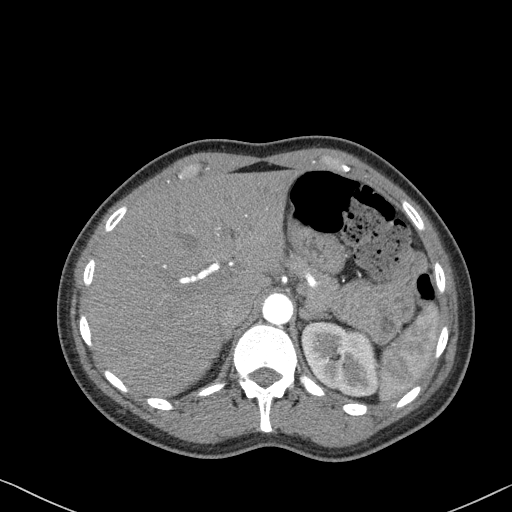
[im 139/160  soft-tissue]
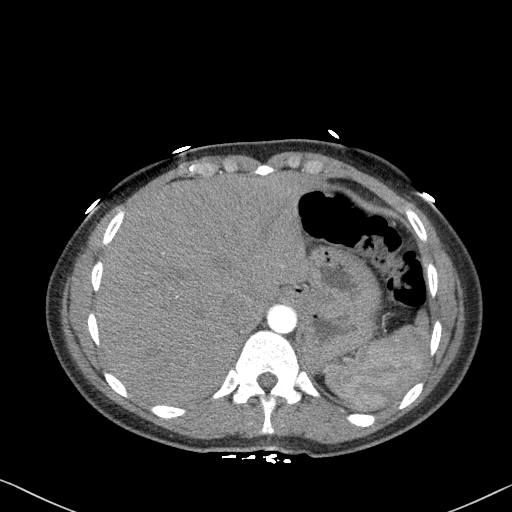
[im 153/160  soft-tissue]
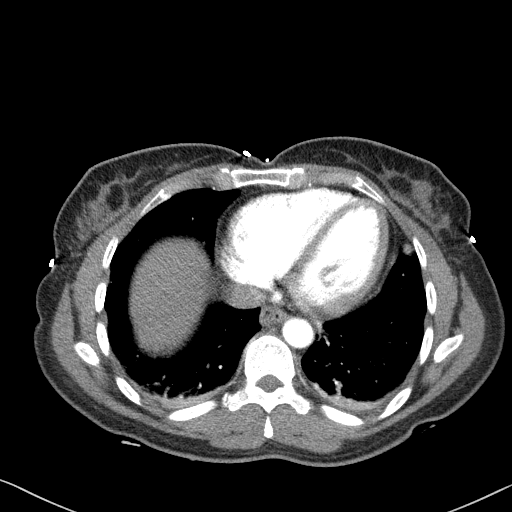

[Series 8: coronal · coronal · 0.87mm/px · 3 of 79 slices shown]
[im 20/79  soft-tissue]
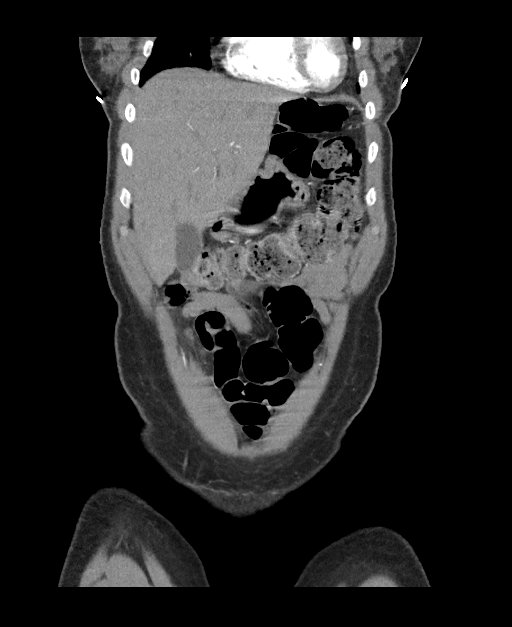
[im 40/79  soft-tissue]
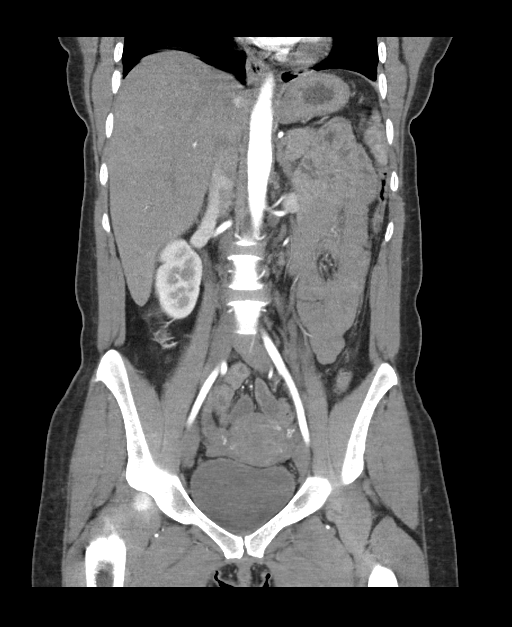
[im 59/79  soft-tissue]
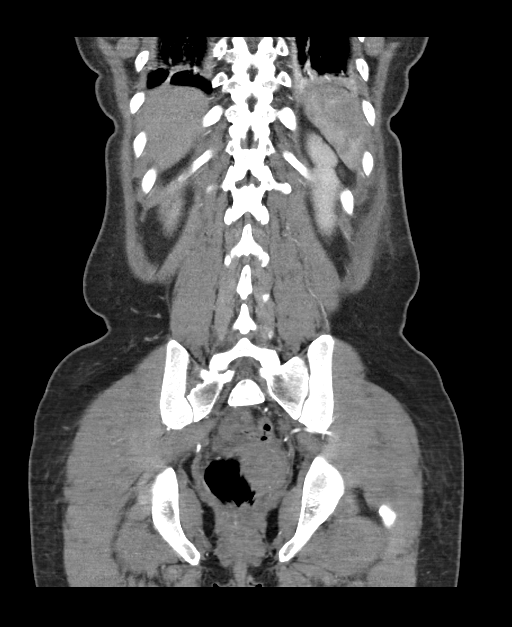

[15 of 46 positions shown; findings below may reference images not displayed]

FINDINGS: VASCULAR

Aorta: Normal caliber aorta without aneurysm, dissection, vasculitis
or significant stenosis.

Celiac: Patent without evidence of aneurysm, dissection, vasculitis
or significant stenosis. The lateral segmental branch of the left
hepatic artery is replaced to the left gastric artery.

SMA: Patent without evidence of aneurysm, dissection, vasculitis or
significant stenosis.

Renals: Both renal arteries are patent without evidence of aneurysm,
dissection, vasculitis, fibromuscular dysplasia or significant
stenosis. Accessory left renal artery to the lower pole.

IMA: Patent without evidence of aneurysm, dissection, vasculitis or
significant stenosis.

Inflow: Patent without evidence of aneurysm, dissection, vasculitis
or significant stenosis.

Proximal Outflow: Bilateral common femoral and visualized portions
of the superficial and profunda femoral arteries are patent without
evidence of aneurysm, dissection, vasculitis or significant
stenosis.

Veins: No obvious venous abnormality within the limitations of this
arterial phase study.

Review of the MIP images confirms the above findings.

NON-VASCULAR

Lower chest: No acute abnormality.

Hepatobiliary: No focal liver abnormality is seen. No gallstones,
gallbladder wall thickening, or biliary dilatation.

Pancreas: Unremarkable. No pancreatic ductal dilatation or
surrounding inflammatory changes.

Spleen: Normal in size without focal abnormality. Normal parenchymal
enhancement pattern. No findings suspicious for infarct.

Adrenals/Urinary Tract: Adrenal glands are unremarkable. No evidence
of hydronephrosis, abnormal renal parenchymal enhancement or
enhancing renal mass. Punctate renal stone present in the lower pole
of the right kidney. Bladder is unremarkable.

Stomach/Bowel: Stomach is within normal limits. Appendix appears
normal. No evidence of bowel wall thickening, distention, or
inflammatory changes.

Lymphatic: No suspicious lymphadenopathy.

Reproductive: Uterus and bilateral adnexa are unremarkable.

Other: No abdominal wall hernia or abnormality. No abdominopelvic
ascites.

Musculoskeletal: No acute or significant osseous findings.
IMPRESSION: VASCULAR

1. No evidence of aneurysm, atherosclerotic plaque, dissection or
other acute arterial abnormality.
2. Lateral segmental branch of the left hepatic artery replaced to
the left gastric artery.
3. Small accessory left renal artery to the lower pole.

NON-VASCULAR

1. No acute abnormality within the abdomen or pelvis. Specifically,
no findings suspicious for left splenic or renal infarct.
2. Punctate right lower pole nephrolithiasis again seen.

## 2021-10-14 ENCOUNTER — Telehealth: Payer: Self-pay | Admitting: Adult Health

## 2021-10-14 ENCOUNTER — Other Ambulatory Visit: Payer: Self-pay | Admitting: Adult Health

## 2021-10-14 NOTE — Telephone Encounter (Signed)
Patient would like to get a sooner appt with Almira Coaster. Please call her and reschedule if there is availability.  ?

## 2021-10-14 NOTE — Telephone Encounter (Signed)
Pt LVM again at 4:06p.  She said she washed them in her laundry by mistake.   ? ?Next appt 5/8 ?

## 2021-10-14 NOTE — Telephone Encounter (Signed)
No early refills - there is a history of this already documented for similar situation. Look at the controlled substance database. Also getting Clonazepam from another doctor. Please advise patient she will no longer be able to receive controlled substances from this practice.

## 2021-10-14 NOTE — Telephone Encounter (Signed)
Mary King called in at 3:00pm and LM. She states she has lost her bottle of Xanax. She is not due for RF for 9 more days. Can she get an early RF? ?[facility] on [address] ?

## 2021-10-14 NOTE — Telephone Encounter (Signed)
Patient was notified of info in regards to controlled substances.  ?

## 2021-10-27 DIAGNOSIS — M5416 Radiculopathy, lumbar region: Secondary | ICD-10-CM | POA: Diagnosis not present

## 2021-10-27 DIAGNOSIS — F1120 Opioid dependence, uncomplicated: Secondary | ICD-10-CM | POA: Diagnosis not present

## 2021-10-27 DIAGNOSIS — S129XXA Fracture of neck, unspecified, initial encounter: Secondary | ICD-10-CM | POA: Diagnosis not present

## 2021-10-27 DIAGNOSIS — M5412 Radiculopathy, cervical region: Secondary | ICD-10-CM | POA: Diagnosis not present

## 2021-10-27 DIAGNOSIS — M62838 Other muscle spasm: Secondary | ICD-10-CM | POA: Diagnosis not present

## 2021-10-30 DIAGNOSIS — F418 Other specified anxiety disorders: Secondary | ICD-10-CM | POA: Diagnosis not present

## 2021-11-03 ENCOUNTER — Ambulatory Visit (INDEPENDENT_AMBULATORY_CARE_PROVIDER_SITE_OTHER): Payer: Self-pay | Admitting: Adult Health

## 2021-11-03 DIAGNOSIS — Z91199 Patient's noncompliance with other medical treatment and regimen due to unspecified reason: Secondary | ICD-10-CM

## 2021-11-03 NOTE — Progress Notes (Signed)
Patient no show appointment. ? ?

## 2022-01-14 DIAGNOSIS — F1120 Opioid dependence, uncomplicated: Secondary | ICD-10-CM | POA: Diagnosis not present

## 2022-01-14 DIAGNOSIS — M5412 Radiculopathy, cervical region: Secondary | ICD-10-CM | POA: Diagnosis not present

## 2022-01-14 DIAGNOSIS — M5416 Radiculopathy, lumbar region: Secondary | ICD-10-CM | POA: Diagnosis not present

## 2022-02-03 ENCOUNTER — Other Ambulatory Visit: Payer: Self-pay | Admitting: Neurological Surgery

## 2022-02-03 DIAGNOSIS — S129XXS Fracture of neck, unspecified, sequela: Secondary | ICD-10-CM

## 2022-03-10 DIAGNOSIS — F411 Generalized anxiety disorder: Secondary | ICD-10-CM | POA: Diagnosis not present

## 2022-03-18 ENCOUNTER — Inpatient Hospital Stay: Admission: RE | Admit: 2022-03-18 | Payer: BC Managed Care – PPO | Source: Ambulatory Visit

## 2022-08-27 DIAGNOSIS — F419 Anxiety disorder, unspecified: Secondary | ICD-10-CM | POA: Diagnosis not present

## 2022-10-16 DIAGNOSIS — D473 Essential (hemorrhagic) thrombocythemia: Secondary | ICD-10-CM | POA: Diagnosis not present

## 2022-10-16 DIAGNOSIS — F419 Anxiety disorder, unspecified: Secondary | ICD-10-CM | POA: Diagnosis not present

## 2022-10-16 DIAGNOSIS — Z13220 Encounter for screening for lipoid disorders: Secondary | ICD-10-CM | POA: Diagnosis not present

## 2022-10-16 DIAGNOSIS — M5090 Cervical disc disorder, unspecified, unspecified cervical region: Secondary | ICD-10-CM | POA: Diagnosis not present

## 2022-10-16 DIAGNOSIS — D649 Anemia, unspecified: Secondary | ICD-10-CM | POA: Diagnosis not present

## 2022-10-16 DIAGNOSIS — G43909 Migraine, unspecified, not intractable, without status migrainosus: Secondary | ICD-10-CM | POA: Diagnosis not present

## 2022-10-16 DIAGNOSIS — Z79899 Other long term (current) drug therapy: Secondary | ICD-10-CM | POA: Diagnosis not present

## 2022-11-26 ENCOUNTER — Encounter (HOSPITAL_COMMUNITY): Payer: Self-pay

## 2022-11-26 ENCOUNTER — Other Ambulatory Visit: Payer: Self-pay

## 2022-11-26 ENCOUNTER — Emergency Department (HOSPITAL_COMMUNITY)
Admission: EM | Admit: 2022-11-26 | Discharge: 2022-11-26 | Disposition: A | Discharge status: Home / Self Care | Payer: BC Managed Care – PPO | Attending: Emergency Medicine | Admitting: Emergency Medicine

## 2022-11-26 DIAGNOSIS — R4182 Altered mental status, unspecified: Secondary | ICD-10-CM | POA: Diagnosis not present

## 2022-11-26 DIAGNOSIS — F1990 Other psychoactive substance use, unspecified, uncomplicated: Secondary | ICD-10-CM

## 2022-11-26 DIAGNOSIS — Y900 Blood alcohol level of less than 20 mg/100 ml: Secondary | ICD-10-CM | POA: Diagnosis not present

## 2022-11-26 DIAGNOSIS — F1210 Cannabis abuse, uncomplicated: Secondary | ICD-10-CM | POA: Diagnosis not present

## 2022-11-26 DIAGNOSIS — F1919 Other psychoactive substance abuse with unspecified psychoactive substance-induced disorder: Secondary | ICD-10-CM | POA: Insufficient documentation

## 2022-11-26 DIAGNOSIS — F1310 Sedative, hypnotic or anxiolytic abuse, uncomplicated: Secondary | ICD-10-CM | POA: Diagnosis not present

## 2022-11-26 LAB — URINALYSIS, W/ REFLEX TO CULTURE (INFECTION SUSPECTED)
Bilirubin Urine: NEGATIVE
Glucose, UA: NEGATIVE mg/dL
Hgb urine dipstick: NEGATIVE
Ketones, ur: NEGATIVE mg/dL
Leukocytes,Ua: NEGATIVE
Nitrite: NEGATIVE
Protein, ur: NEGATIVE mg/dL
Specific Gravity, Urine: 1.012 (ref 1.005–1.030)
pH: 7 (ref 5.0–8.0)

## 2022-11-26 LAB — RAPID URINE DRUG SCREEN, HOSP PERFORMED
Amphetamines: NOT DETECTED
Barbiturates: NOT DETECTED
Benzodiazepines: POSITIVE — AB
Cocaine: NOT DETECTED
Opiates: NOT DETECTED
Tetrahydrocannabinol: POSITIVE — AB

## 2022-11-26 LAB — CBC WITH DIFFERENTIAL/PLATELET
Abs Immature Granulocytes: 0.01 10*3/uL (ref 0.00–0.07)
Basophils Absolute: 0 10*3/uL (ref 0.0–0.1)
Basophils Relative: 1 %
Eosinophils Absolute: 0.2 10*3/uL (ref 0.0–0.5)
Eosinophils Relative: 4 %
HCT: 33.5 % — ABNORMAL LOW (ref 36.0–46.0)
Hemoglobin: 9.9 g/dL — ABNORMAL LOW (ref 12.0–15.0)
Immature Granulocytes: 0 %
Lymphocytes Relative: 15 %
Lymphs Abs: 0.8 10*3/uL (ref 0.7–4.0)
MCH: 26.8 pg (ref 26.0–34.0)
MCHC: 29.6 g/dL — ABNORMAL LOW (ref 30.0–36.0)
MCV: 90.5 fL (ref 80.0–100.0)
Monocytes Absolute: 0.3 10*3/uL (ref 0.1–1.0)
Monocytes Relative: 6 %
Neutro Abs: 3.9 10*3/uL (ref 1.7–7.7)
Neutrophils Relative %: 74 %
Platelets: 368 10*3/uL (ref 150–400)
RBC: 3.7 MIL/uL — ABNORMAL LOW (ref 3.87–5.11)
RDW: 18.4 % — ABNORMAL HIGH (ref 11.5–15.5)
WBC: 5.3 10*3/uL (ref 4.0–10.5)
nRBC: 0 % (ref 0.0–0.2)

## 2022-11-26 LAB — SALICYLATE LEVEL: Salicylate Lvl: <7 mg/dL — ABNORMAL LOW (ref 7.0–30.0)

## 2022-11-26 LAB — COMPREHENSIVE METABOLIC PANEL
ALT: 19 U/L (ref 0–44)
AST: 25 U/L (ref 15–41)
Albumin: 3.7 g/dL (ref 3.5–5.0)
Alkaline Phosphatase: 46 U/L (ref 38–126)
Anion gap: 10 (ref 5–15)
BUN: 8 mg/dL (ref 6–20)
CO2: 25 mmol/L (ref 22–32)
Calcium: 9 mg/dL (ref 8.9–10.3)
Chloride: 107 mmol/L (ref 98–111)
Creatinine, Ser: 0.77 mg/dL (ref 0.44–1.00)
GFR, Estimated: >60 mL/min (ref >60)
Glucose, Bld: 108 mg/dL — ABNORMAL HIGH (ref 70–99)
Potassium: 4.4 mmol/L (ref 3.5–5.1)
Sodium: 142 mmol/L (ref 135–145)
Total Bilirubin: 0.4 mg/dL (ref 0.3–1.2)
Total Protein: 6.4 g/dL — ABNORMAL LOW (ref 6.5–8.1)

## 2022-11-26 LAB — ETHANOL: Alcohol, Ethyl (B): <10 mg/dL (ref <10)

## 2022-11-26 LAB — ACETAMINOPHEN LEVEL: Acetaminophen (Tylenol), Serum: <10 ug/mL — ABNORMAL LOW (ref 10–30)

## 2022-11-26 NOTE — ED Triage Notes (Signed)
Pt got xanax prescription refilled last week on the 20th and there was 120 pills was supposed to be taken four times a day for 30 days. Pt has been taking xanax 8 times a day. Pt came home from the beach and has been more forgetful and not acting herself. Pt admits to taking kratom, delta 8 and CBD. Pt admits to using kratom 5 tablets last night. Pt admits taking using multiple times a day. Pt's mom gave her a xanax last night. Pt is slow to answer questions.

## 2022-11-26 NOTE — ED Notes (Signed)
Patient verbalizes understanding of discharge instructions. Opportunity for questioning and answers were provided. Pt discharged from ED. 

## 2022-11-26 NOTE — Discharge Instructions (Addendum)
You should not take prescribed medications more often than recommended, taking more Xanax than prescribed and can cause you to stop breathing.   Avoid mixing Xanax with other substances such as marijuana or Kratom  Use resources provided to help with substance abuse.

## 2022-11-26 NOTE — ED Provider Notes (Signed)
[place] EMERGENCY DEPARTMENT AT [facility] Provider Note   CSN: [id number] Arrival date & time: 11/26/22  0732     History  Chief Complaint  Patient presents with   Altered Mental Status   Drug Overdose    Jahira L Burdine is a 42 y.o. female.  Sascha L Laurel is a 42 y.o. female with a history of chronic pain, anxiety, GERD, who presents to the emergency department accompanied by her mother for evaluation of altered mental status.  Mom reports that she has been sleepy and out of it over the last few days.  Last week she was prescribed 120 tablets of Xanax which she was supposed to take as needed for times daily over 30 days.  Patient reports she has been taking this at least 8 times daily over the past few days and only has 5 tablets left.  Her mom took the medication away it gave her 1 tablet last night because she was extremely anxious and her mom was concerned she would go into withdrawals.  She was at the beach last week and and in addition to consuming 8 tablets of Xanax per day she was also taking kratom, delta 8, and CBD as well as using nicotine.  Patient ports continued alcohol.  She reports that she last used kratom and delta 8 last night.  The history is provided by the patient.  Altered Mental Status Presenting symptoms: confusion   Associated symptoms: no abdominal pain, no fever, no nausea and no vomiting   Drug Overdose Pertinent negatives include no chest pain, no abdominal pain and no shortness of breath.       Home Medications Prior to Admission medications   Medication Sig Start Date End Date Taking? Authorizing Provider  ALPRAZolam Prudy Feeler) 1 MG tablet Take 1 tablet (1 mg total) by mouth 4 (four) times daily as needed for anxiety. Patient taking differently: Take 1 mg by mouth 4 (four) times daily. 08/06/21  Yes Mozingo, Thereasa Solo, NP  busPIRone (BUSPAR) 5 MG tablet Take 10 mg by mouth 2 (two) times daily. 08/27/22  Yes [provider]  NON FORMULARY Take 1 tablet by mouth as needed (Pain). Kratom   Yes [provider]  sertraline (ZOLOFT) 100 MG tablet Take 100 mg by mouth daily.   Yes [provider]  Turmeric (QC TUMERIC COMPLEX PO) Take 1 tablet by mouth daily.   Yes [provider]      Allergies    Codeine    Review of Systems   Review of Systems  Constitutional:  Negative for chills and fever.  Respiratory:  Negative for shortness of breath.   Cardiovascular:  Negative for chest pain.  Gastrointestinal:  Negative for abdominal pain, nausea and vomiting.  Psychiatric/Behavioral:  Positive for confusion and decreased concentration. Negative for suicidal ideas.     Physical Exam Updated Vital Signs BP 118/84 (BP Location: Right Arm)   Pulse 63   Temp 98.6 F (37 C) (Oral)   Resp 13   Ht 5\' 5"  (1.651 m)   Wt 65.8 kg   SpO2 99%   BMI 24.14 kg/m  Physical Exam Vitals and nursing note reviewed.  Constitutional:      General: She is not in acute distress.    Appearance: Normal appearance. She is well-developed. She is not diaphoretic.     Comments: Patient is somnolent but easily arousable to verbal stimuli and is able to answer questions  HENT:  Head: Normocephalic and atraumatic.     Mouth/Throat:     Mouth: Mucous membranes are moist.     Pharynx: Oropharynx is clear.  Eyes:     General:        Right eye: No discharge.        Left eye: No discharge.     Extraocular Movements: Extraocular movements intact.     Pupils: Pupils are equal, round, and reactive to light.  Cardiovascular:     Rate and Rhythm: Normal rate and regular rhythm.     Pulses: Normal pulses.     Heart sounds: Normal heart sounds.  Pulmonary:     Effort: Pulmonary effort is normal. No respiratory distress.     Breath sounds: Normal breath sounds. No wheezing or rales.     Comments: Respirations equal and unlabored, patient able to speak in full sentences, lungs clear to  auscultation bilaterally  Abdominal:     General: Bowel sounds are normal. There is no distension.     Palpations: Abdomen is soft. There is no mass.     Tenderness: There is no abdominal tenderness. There is no guarding.     Comments: Abdomen soft, nondistended, nontender to palpation in all quadrants without guarding or peritoneal signs  Musculoskeletal:        General: No deformity.     Cervical back: Neck supple.  Skin:    General: Skin is warm and dry.     Capillary Refill: Capillary refill takes less than 2 seconds.  Neurological:     Mental Status: She is alert and oriented to person, place, and time.     Coordination: Coordination normal.     Comments: Speech is clear, able to follow commands CN III-XII intact Normal strength in upper and lower extremities bilaterally including dorsiflexion and plantar flexion, strong and equal grip strength Sensation normal to light and sharp touch Moves extremities without ataxia, coordination intact  Psychiatric:        Mood and Affect: Mood normal.        Behavior: Behavior normal.     ED Results / Procedures / Treatments   Labs (all labs ordered are listed, but only abnormal results are displayed) Labs Reviewed  COMPREHENSIVE METABOLIC PANEL - Abnormal; Notable for the following components:      Result Value   Glucose, Bld 108 (*)    Total Protein 6.4 (*)    All other components within normal limits  CBC WITH DIFFERENTIAL/PLATELET - Abnormal; Notable for the following components:   RBC 3.70 (*)    Hemoglobin 9.9 (*)    HCT 33.5 (*)    MCHC 29.6 (*)    RDW 18.4 (*)    All other components within normal limits  ACETAMINOPHEN LEVEL - Abnormal; Notable for the following components:   Acetaminophen (Tylenol), Serum <10 (*)    All other components within normal limits  SALICYLATE LEVEL - Abnormal; Notable for the following components:   Salicylate Lvl <7.0 (*)    All other components within normal limits  URINALYSIS, W/ REFLEX  TO CULTURE (INFECTION SUSPECTED) - Abnormal; Notable for the following components:   APPearance HAZY (*)    Bacteria, UA RARE (*)    All other components within normal limits  RAPID URINE DRUG SCREEN, HOSP PERFORMED - Abnormal; Notable for the following components:   Benzodiazepines POSITIVE (*)    Tetrahydrocannabinol POSITIVE (*)    All other components within normal limits  ETHANOL    EKG None  Radiology No results found.  Procedures Procedures    Medications Ordered in ED Medications - No data to display  ED Course/ Medical Decision Making/ A&P                             Medical Decision Making Amount and/or Complexity of Data Reviewed Labs: ordered.   42 y.o. female presents to the ED with complaints of AMS, this involves an extensive number of treatment options, and is a complaint that carries with it a high risk of complications and morbidity.  The differential diagnosis includes polysubstance abuse, electrolyte abnormality, renal or liver dysfunction, infection  On arrival pt is nontoxic, vitals WNL. Exam significant for no neurologic deficits  Additional history obtained from mom at bedside. Previous records obtained and reviewed    Lab Tests:  I Ordered, reviewed, and interpreted labs, which included: No leukocytosis, stable hemoglobin, no electrolyte derangements, normal renal and liver function, UA without signs of infection, UDS positive for benzos and THC, negative ethanol, salicylate and Tylenol levels.  Imaging Studies ordered:  Considered imaging but given reassuring physical exam and reported polysubstance use do not currently change management at this time  ED Course:   Patient observed in the ED for several hours he is now much more alert and responsive.  I suspect symptoms are related to ongoing polysubstance abuse.  Especially with significant overuse of benzos.  Discussed potential consequences of continuing to use these medications  inappropriately as well as combining with other recreational substances.  Provided resources for substance use treatment.  At this time patient is stable for discharge home.  Patient and mother expressed understanding and agreement with plan.  Return precautions provided.    Portions of this note were generated with Scientist, clinical (histocompatibility and immunogenetics). Dictation errors may occur despite best attempts at proofreading.         Final Clinical Impression(s) / ED Diagnoses Final diagnoses:  Polysubstance use disorder    Rx / DC Orders ED Discharge Orders     None         Dartha Lodge, PA-C 12/17/22 1027    Elayne Snare K, DO 12/17/22 1042

## 2023-02-15 DIAGNOSIS — F1910 Other psychoactive substance abuse, uncomplicated: Secondary | ICD-10-CM | POA: Diagnosis not present

## 2023-02-15 DIAGNOSIS — F419 Anxiety disorder, unspecified: Secondary | ICD-10-CM | POA: Diagnosis not present
